# Patient Record
Sex: Female | Born: 1955 | Race: Black or African American | Hispanic: No | Marital: Married | Smoking: Never smoker
Health system: Southern US, Community
[De-identification: ages and names within clinical notes are randomized; demographics above are authoritative.]

## PROBLEM LIST (undated history)

## (undated) DIAGNOSIS — T7840XA Allergy, unspecified, initial encounter: Secondary | ICD-10-CM

## (undated) HISTORY — PX: BREAST EXCISIONAL BIOPSY: SUR124

## (undated) HISTORY — PX: COLONOSCOPY: SHX174

## (undated) HISTORY — DX: Allergy, unspecified, initial encounter: T78.40XA

## (undated) HISTORY — PX: UTERINE FIBROID SURGERY: SHX826

## (undated) HISTORY — PX: TUBAL LIGATION: SHX77

---

## 1999-06-16 ENCOUNTER — Other Ambulatory Visit: Admission: RE | Admit: 1999-06-16 | Discharge: 1999-06-16 | Payer: Self-pay | Admitting: Internal Medicine

## 2000-09-13 ENCOUNTER — Other Ambulatory Visit: Admission: RE | Admit: 2000-09-13 | Discharge: 2000-09-13 | Payer: Self-pay | Admitting: Obstetrics and Gynecology

## 2001-11-19 ENCOUNTER — Other Ambulatory Visit: Admission: RE | Admit: 2001-11-19 | Discharge: 2001-11-19 | Payer: Self-pay | Admitting: Obstetrics and Gynecology

## 2002-11-19 ENCOUNTER — Other Ambulatory Visit: Admission: RE | Admit: 2002-11-19 | Discharge: 2002-11-19 | Payer: Self-pay | Admitting: Obstetrics and Gynecology

## 2002-12-15 ENCOUNTER — Ambulatory Visit (HOSPITAL_COMMUNITY): Admission: RE | Admit: 2002-12-15 | Discharge: 2002-12-15 | Payer: Self-pay | Admitting: Obstetrics and Gynecology

## 2003-11-27 ENCOUNTER — Other Ambulatory Visit: Admission: RE | Admit: 2003-11-27 | Discharge: 2003-11-27 | Payer: Self-pay | Admitting: Obstetrics and Gynecology

## 2003-12-01 ENCOUNTER — Encounter: Admission: RE | Admit: 2003-12-01 | Discharge: 2003-12-01 | Payer: Self-pay | Admitting: Obstetrics and Gynecology

## 2004-12-05 ENCOUNTER — Other Ambulatory Visit: Admission: RE | Admit: 2004-12-05 | Discharge: 2004-12-05 | Payer: Self-pay | Admitting: Obstetrics and Gynecology

## 2005-12-07 ENCOUNTER — Other Ambulatory Visit: Admission: RE | Admit: 2005-12-07 | Discharge: 2005-12-07 | Payer: Self-pay | Admitting: Obstetrics and Gynecology

## 2009-01-06 ENCOUNTER — Encounter: Admission: RE | Admit: 2009-01-06 | Discharge: 2009-01-06 | Payer: Self-pay | Admitting: Obstetrics and Gynecology

## 2009-06-16 ENCOUNTER — Encounter: Admission: RE | Admit: 2009-06-16 | Discharge: 2009-06-16 | Payer: Self-pay | Admitting: Obstetrics and Gynecology

## 2009-06-19 ENCOUNTER — Encounter: Admission: RE | Admit: 2009-06-19 | Discharge: 2009-06-19 | Payer: Self-pay | Admitting: Obstetrics and Gynecology

## 2009-07-14 ENCOUNTER — Encounter: Admission: RE | Admit: 2009-07-14 | Discharge: 2009-07-14 | Payer: Self-pay | Admitting: Obstetrics and Gynecology

## 2009-07-22 ENCOUNTER — Ambulatory Visit (HOSPITAL_COMMUNITY): Admission: RE | Admit: 2009-07-22 | Discharge: 2009-07-23 | Payer: Self-pay | Admitting: Obstetrics and Gynecology

## 2009-08-17 ENCOUNTER — Encounter: Admission: RE | Admit: 2009-08-17 | Discharge: 2009-08-17 | Payer: Self-pay | Admitting: Interventional Radiology

## 2010-01-07 ENCOUNTER — Encounter: Admission: RE | Admit: 2010-01-07 | Discharge: 2010-01-07 | Payer: Self-pay | Admitting: Obstetrics and Gynecology

## 2010-03-02 ENCOUNTER — Ambulatory Visit (HOSPITAL_COMMUNITY): Admission: RE | Admit: 2010-03-02 | Discharge: 2010-03-02 | Payer: Self-pay | Admitting: Interventional Radiology

## 2010-03-02 ENCOUNTER — Encounter: Admission: RE | Admit: 2010-03-02 | Discharge: 2010-03-02 | Payer: Self-pay | Admitting: Interventional Radiology

## 2010-11-27 ENCOUNTER — Encounter: Payer: Self-pay | Admitting: Interventional Radiology

## 2010-11-27 ENCOUNTER — Encounter: Payer: Self-pay | Admitting: Obstetrics and Gynecology

## 2011-02-10 LAB — CBC
HCT: 40 % (ref 36.0–46.0)
Hemoglobin: 13.4 g/dL (ref 12.0–15.0)
MCHC: 33.4 g/dL (ref 30.0–36.0)
MCV: 87.6 fL (ref 78.0–100.0)
Platelets: 203 10*3/uL (ref 150–400)
RBC: 4.57 MIL/uL (ref 3.87–5.11)
RDW: 13.9 % (ref 11.5–15.5)
WBC: 4.1 10*3/uL (ref 4.0–10.5)

## 2011-02-10 LAB — BASIC METABOLIC PANEL
BUN: 15 mg/dL (ref 6–23)
CO2: 25 mEq/L (ref 19–32)
Calcium: 9.5 mg/dL (ref 8.4–10.5)
Chloride: 105 mEq/L (ref 96–112)
Creatinine, Ser: 0.93 mg/dL (ref 0.4–1.2)
GFR calc Af Amer: >60 mL/min (ref >60)
GFR calc non Af Amer: >60 mL/min (ref >60)
Glucose, Bld: 99 mg/dL (ref 70–99)
Potassium: 3.9 mEq/L (ref 3.5–5.1)
Sodium: 138 mEq/L (ref 135–145)

## 2011-02-10 LAB — PROTIME-INR
INR: 0.9 (ref 0.00–1.49)
Prothrombin Time: 12.4 seconds (ref 11.6–15.2)

## 2011-02-10 LAB — HCG, SERUM, QUALITATIVE: Preg, Serum: NEGATIVE

## 2011-02-27 ENCOUNTER — Other Ambulatory Visit: Payer: Self-pay | Admitting: Obstetrics and Gynecology

## 2011-02-27 DIAGNOSIS — N63 Unspecified lump in unspecified breast: Secondary | ICD-10-CM

## 2011-02-28 ENCOUNTER — Other Ambulatory Visit: Payer: Self-pay | Admitting: Obstetrics and Gynecology

## 2011-02-28 ENCOUNTER — Ambulatory Visit
Admission: RE | Admit: 2011-02-28 | Discharge: 2011-02-28 | Disposition: A | Discharge status: Home / Self Care | Payer: 59 | Source: Ambulatory Visit | Attending: Obstetrics and Gynecology | Admitting: Obstetrics and Gynecology

## 2011-02-28 DIAGNOSIS — N63 Unspecified lump in unspecified breast: Secondary | ICD-10-CM

## 2011-03-18 IMAGING — XA IR UTERINE FIBROID EMBOLIZATION
1 series · 12 of 24 positions shown · non-contrast
Comparison: none

Clinical Data/Indication: MENORRHAGIA

UTERINE ARTERY EMBOLIZATION
Sedation: Versed threemg, Fentanyl 150 mg.
Total Moderate Sedation Time: 75 minutes.
Contrast Volume: 70 ml [id number].
Additional Medications: None.
Fluoroscopy Time: 27.5 minutes.
Vessels selected:  Third order bilateral internal iliac.
Allergies:  .
Procedure: The procedure, risks, benefits, and alternatives were
explained to the patient. Questions regarding the procedure were
encouraged and answered. The patient understands and consents to
the procedure.
The right groin was prepped with betadine in a sterile fashion, and
a sterile drape was applied covering the operative field. A sterile
gown and sterile gloves were used for the procedure.
A 19 gauge needle was inserted into the right common femoral artery
and removed over Auntyjatty Delowr.  Mcafee 5-French sheath was inserted.  Cobra
II catheter was advanced into the aorta.  The contralateral left
common iliac artery was selected.  The internal iliac artery on the
left was selected.  Angiography was performed.  A micro catheter
was advanced over an 018 glide wire into the left uterine artery.
Angiography was performed.
Embolization was performed utilizing two vials of 500-700 micron
embospheres.
The micro catheter was removed and flushed.  Osniel Bogle loop was
created.  The ipsilateral right common iliac artery and right
internal iliac artery were selected.  Angiography was performed.  A
micro catheter was advanced over a 6638 glide wire into the right
uterine artery.  Angiography was performed.
Embolization was again performed with two vials of 500-700 micron
The micro catheter and Cobra catheter were removed.  Right femoral
angiography was performed. A STARCLOSE device was deployed without
complication and hemostasis was achieved.

[Series 1: run · 12 of 83 slices shown]
[im 4/83]
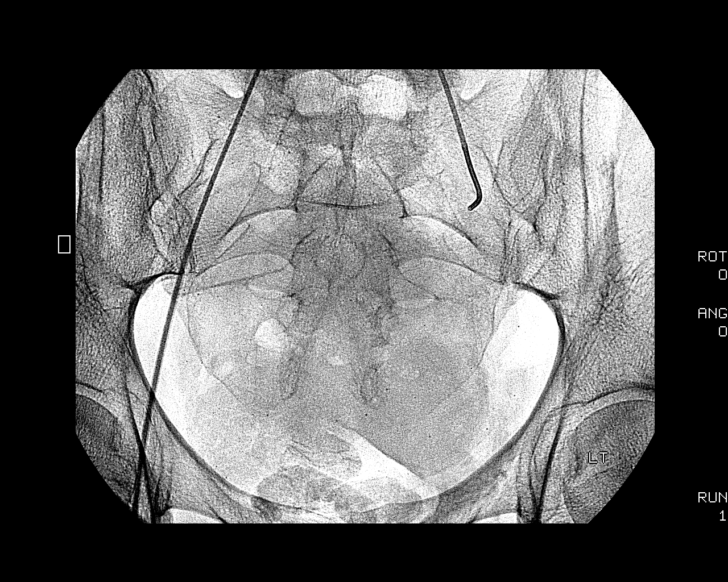
[im 11/83]
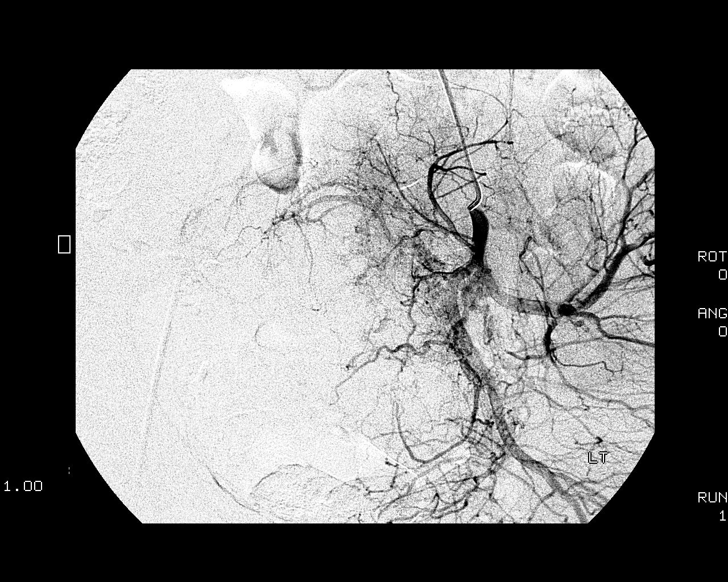
[im 18/83]
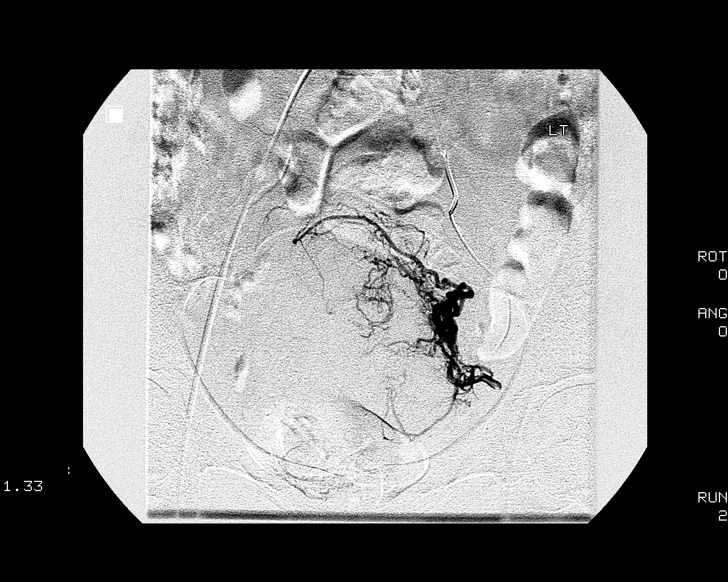
[im 25/83]
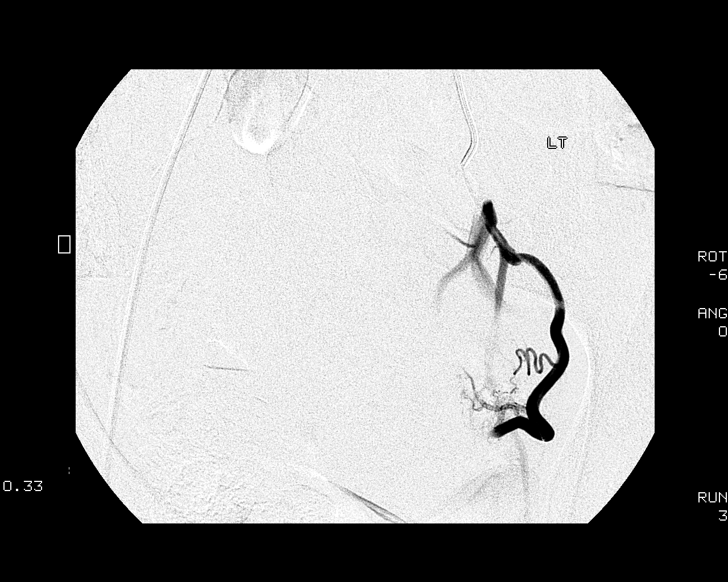
[im 33/83]
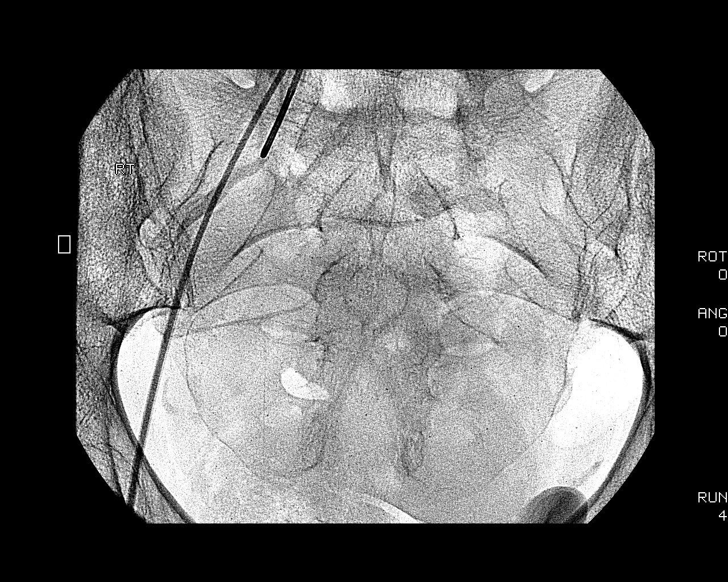
[im 40/83]
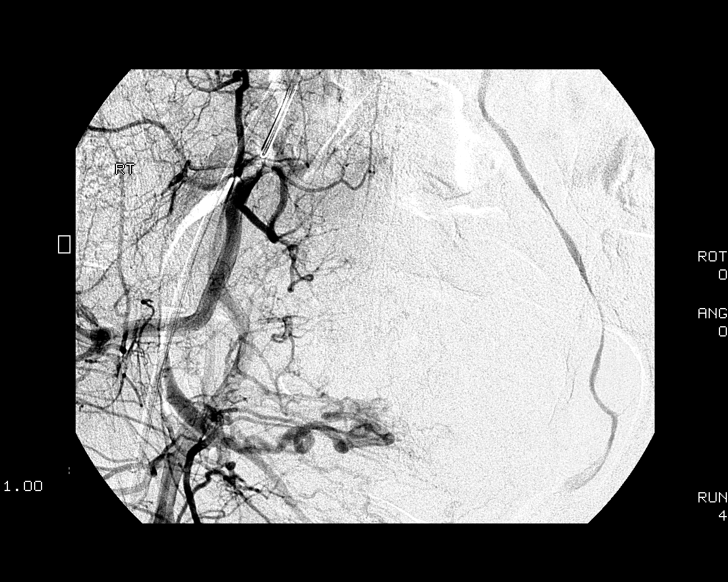
[im 47/83]
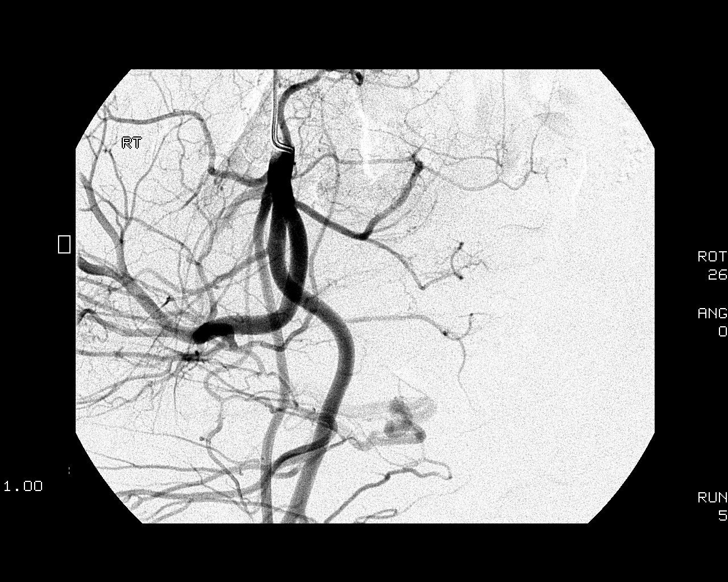
[im 54/83]
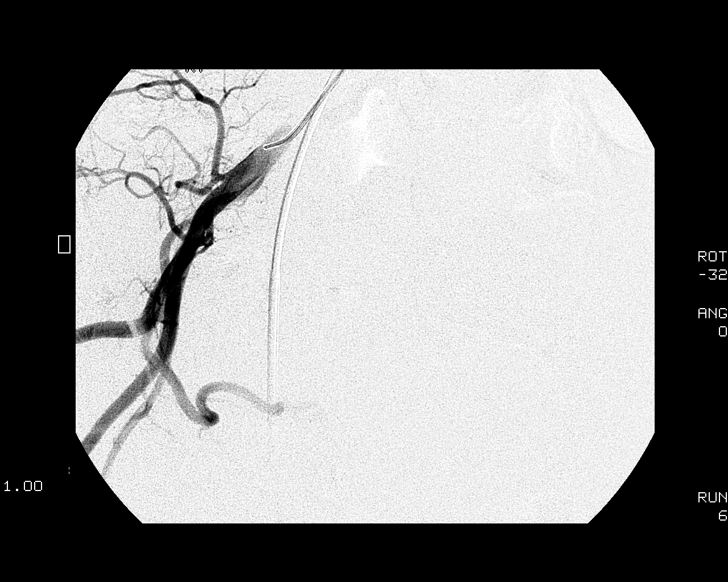
[im 61/83]
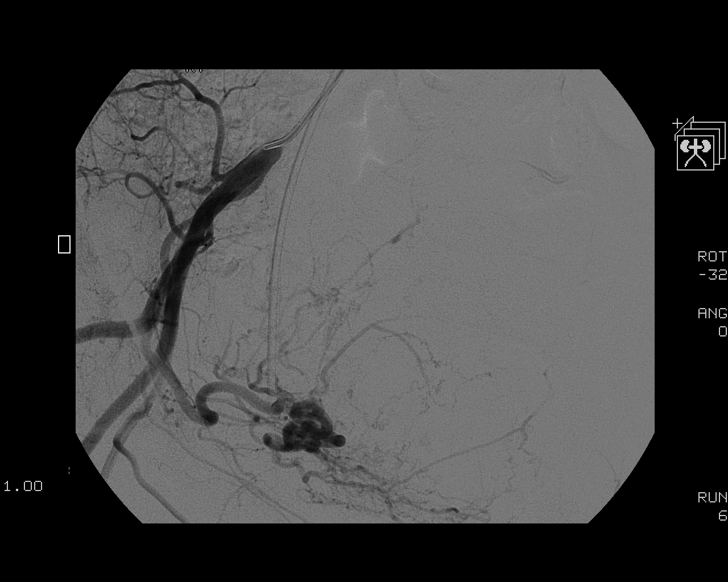
[im 68/83]
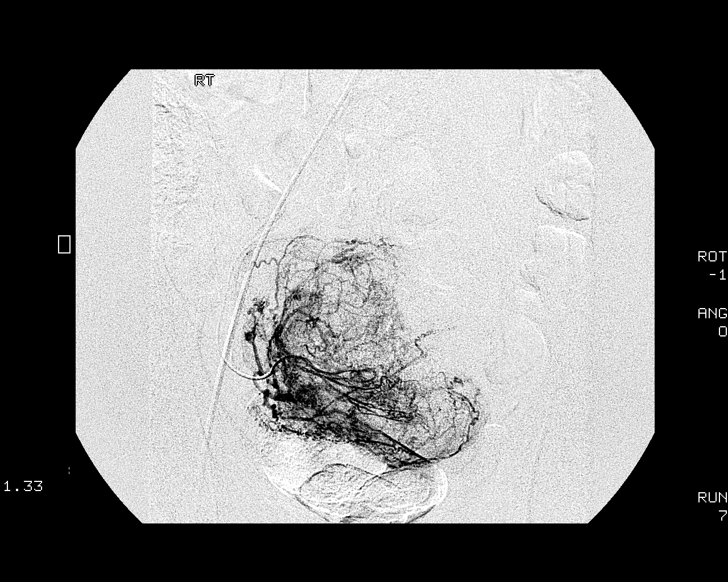
[im 75/83]
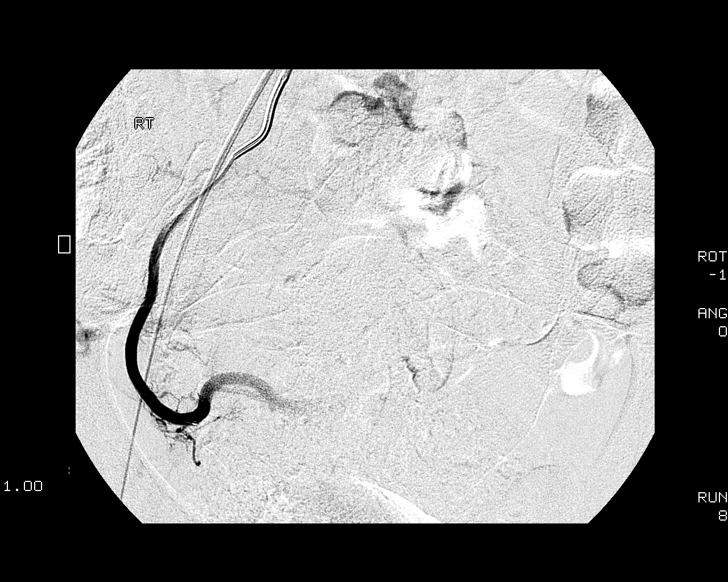
[im 83/83]
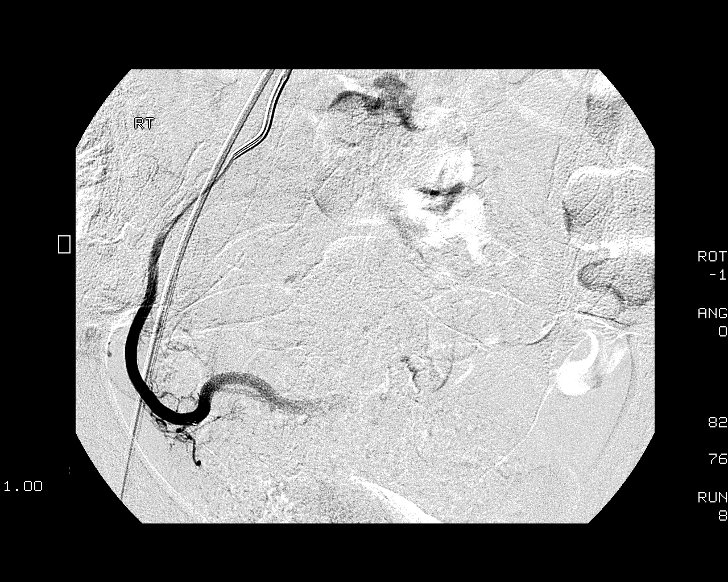

[12 of 24 positions shown; findings below may reference images not displayed]

FINDINGS: Imaging demonstrates bilateral pelvic anatomy and
bilateral uterine artery angiography.  Expected anatomy is
identified.  No blood supply from the uterine artery to the vagina
is visualized.

Post embolization angiography demonstrates relative stasis of flow
and pruning of the vasculature to the uterus.

Complications: None
IMPRESSION: Successful bilateral uterine artery embolization.

## 2011-03-24 NOTE — Op Note (Signed)
NAME:  Megan Mccoy, Megan Mccoy                         ACCOUNT NO.:  [id number]   MEDICAL RECORD NO.:  [id number]                   PATIENT TYPE:  AMB   LOCATION:  SDC                                  FACILITY:  [facility]   PHYSICIAN:  Randye Lobo, M.D.                DATE OF BIRTH:  03/09/1956   DATE OF PROCEDURE:  12/15/2002  DATE OF DISCHARGE:                                 OPERATIVE REPORT   PREOPERATIVE DIAGNOSES:  Multiparous female, desire for permanent  sterilization.   POSTOPERATIVE DIAGNOSES:  1. Multiparous female, desire for permanent sterilization.  2. Fitz-Hugh-Curtis syndrome.  3. Pelvic adhesions.   PROCEDURE:  1. Laparoscopic bilateral tubal ligation via fulguration.  2. Lysis of adhesions.   SURGEON:  Randye Lobo, M.D.   ANESTHESIA:  General endotracheal, local with 0.25% Marcaine.   IV FLUIDS:  100 mL Ringer's lactate.   ESTIMATED BLOOD LOSS:  Minimal.   URINE OUTPUT:  75 mL prior to the procedure.   COMPLICATIONS:  None.   INDICATIONS FOR PROCEDURE:  The patient is a 55 year old gravida 2, para 2  African-American female who presented to the office requesting permanent  sterilization.  The patient had declined forms of reversible contraception.  The patient was counseled regarding her options and chose to proceed with  tubal ligation after the risks, benefits, and alternatives were discussed  with her.  The patient was counseled regarding a failure rate of the  procedure which was approximately 1 in 250 to 1 in 300 which may result in  either an intrauterine or an ectopic pregnancy.   FINDINGS:  Laparoscopy demonstrated a uterus with a 1 cm fundal subserosal  fibroid which was adherent to the anterior abdominal wall by a dense  fibrotic and vascular adhesion.  The left fallopian tube had adhesions  between the fimbriated end and the ovary.  The right fallopian tube and  ovaries appeared to be normal.  There were also filmy adhesions in the  posterior  cul-de-sac which partly obliterated it.  The upper abdomen  demonstrated evidence of Fitz-Hugh-Curtis syndrome.  A portion of the  appendix was visualized and was noted to be normal.   PROCEDURE:  The patient was taken to the operating room after she was  properly identified.  The patient did receive Ancef 1 g intravenously for  antibiotic prophylaxis.  In the operating room the patient received general  endotracheal anesthesia and she was then placed in the dorsal lithotomy  position.  The abdomen and vagina were sterilely prepped and draped and the  bladder was catheterized of urine.  A speculum was placed inside the vagina  and a Hulka tenaculum was then placed.  The remainder of the instruments in  the vagina were removed at this time.  An umbilical incision was then  created sharply with a scalpel and the incision was carried down to the  fascia using an  Allis clamp.  A 10 mm trocar was then inserted directly into  the peritoneal cavity and the laparoscope confirmed proper placement.  A  pneumoperitoneum was achieved with CO2 gas and the patient was placed in the  Trendelenburg position.  A 5 mm incision was then created suprapubically and  a 5 mm trocar was placed in the midline under direct visualization of the  laparoscope.  An examination of the pelvic and abdominal organs was  performed and the findings are as noted above.  The Kleppinger forceps was  used to grasp the thick adhesion band between the uterine fibroid and the  anterior abdominal wall.  The adhesion was then cut and each end of the  adhesion was then coagulated again with bipolar cautery for excellent  hemostasis.  Attention was then turned to the right fallopian tube which was  grasped and followed all the way to its fimbriated end.  A 2 cm contiguous  segment of the fallopian tube was then coagulated for excellent blanching of  the tissue.  The same procedure performed on the right fallopian tube was  then  repeated on the left after that tube was grasped and followed to its  fimbriated end.  Again, a 2 cm contiguous segment of the fallopian tube was  fulgurated.  The filmy adhesions in the posterior cul-de-sac were then lysed  normalizing the anatomy in this area which was concentrated mostly on the  patient's left-hand side.  The edges of the adhesions were then coagulated  with bipolar cautery to create hemostasis.  All of the operative sites were  reexamined and there was no evidence of any bleeding noted.  The procedure  was therefore concluded.  The suprapubic trocar was removed under  visualization of the laparoscope.  The pneumoperitoneum was released and the  abdominal trocar and the laparoscope were removed simultaneously.  The skin  incisions were closed with subcuticular sutures of 3-0 plain and Steri-  Strips were placed over the incisions followed by bandages.  Marcaine 0.25%  was injected locally into the incisions. The Hulka tenaculum was removed  from the patient's cervix. The patient was cleansed of remaining Betadine,  awakened, extubated, and escorted to the recovery room in stable and awake  condition.  There were no complications to the procedure.  All needle,  instrument, and sponge counts were correct.                                               Randye Lobo, M.D.    BES/MEDQ  D:  12/15/2002  T:  12/15/2002  Job:  [id number]

## 2011-11-02 ENCOUNTER — Ambulatory Visit (INDEPENDENT_AMBULATORY_CARE_PROVIDER_SITE_OTHER): Payer: 59

## 2011-11-02 DIAGNOSIS — J029 Acute pharyngitis, unspecified: Secondary | ICD-10-CM

## 2011-11-02 DIAGNOSIS — J069 Acute upper respiratory infection, unspecified: Secondary | ICD-10-CM

## 2012-04-12 ENCOUNTER — Other Ambulatory Visit: Payer: Self-pay | Admitting: Obstetrics and Gynecology

## 2012-04-12 DIAGNOSIS — Z1231 Encounter for screening mammogram for malignant neoplasm of breast: Secondary | ICD-10-CM

## 2012-04-15 ENCOUNTER — Other Ambulatory Visit: Payer: Self-pay | Admitting: Obstetrics and Gynecology

## 2012-04-15 DIAGNOSIS — N6321 Unspecified lump in the left breast, upper outer quadrant: Secondary | ICD-10-CM

## 2012-04-23 ENCOUNTER — Ambulatory Visit
Admission: RE | Admit: 2012-04-23 | Discharge: 2012-04-23 | Disposition: A | Discharge status: Home / Self Care | Payer: 59 | Source: Ambulatory Visit | Attending: Obstetrics and Gynecology | Admitting: Obstetrics and Gynecology

## 2012-04-23 DIAGNOSIS — N6321 Unspecified lump in the left breast, upper outer quadrant: Secondary | ICD-10-CM

## 2012-04-26 ENCOUNTER — Ambulatory Visit: Payer: 59

## 2013-05-13 ENCOUNTER — Other Ambulatory Visit: Payer: Self-pay

## 2013-05-13 DIAGNOSIS — Z1231 Encounter for screening mammogram for malignant neoplasm of breast: Secondary | ICD-10-CM

## 2013-05-29 ENCOUNTER — Ambulatory Visit
Admission: RE | Admit: 2013-05-29 | Discharge: 2013-05-29 | Disposition: A | Discharge status: Home / Self Care | Payer: Self-pay | Source: Ambulatory Visit

## 2013-05-29 DIAGNOSIS — Z1231 Encounter for screening mammogram for malignant neoplasm of breast: Secondary | ICD-10-CM

## 2013-06-05 ENCOUNTER — Telehealth: Payer: Self-pay

## 2013-06-05 NOTE — Telephone Encounter (Signed)
Message copied by Alphonsa Overall on Thu Jun 05, 2013 10:20 AM ------      Message from: Conley Simmonds      Created: Fri May 30, 2013 11:54 AM       Please inform patient that I received her normal mammogram results.            She is a former patient of mine.              I will be happy to see her for an annual examination if she chooses and is due.       ------

## 2013-06-05 NOTE — Telephone Encounter (Signed)
Patient given results of normal mammogram and will schedule AEX with Dr. Edward Jolly.

## 2013-06-05 NOTE — Telephone Encounter (Signed)
Patient returning Amanda's call.

## 2013-06-05 NOTE — Telephone Encounter (Signed)
LMOVM to call regarding mammogram results.

## 2013-06-11 ENCOUNTER — Encounter: Payer: 59 | Admitting: Obstetrics and Gynecology

## 2015-03-03 ENCOUNTER — Other Ambulatory Visit: Payer: Self-pay

## 2015-03-03 DIAGNOSIS — Z1231 Encounter for screening mammogram for malignant neoplasm of breast: Secondary | ICD-10-CM

## 2015-03-08 ENCOUNTER — Ambulatory Visit
Admission: RE | Admit: 2015-03-08 | Discharge: 2015-03-08 | Disposition: A | Discharge status: Home / Self Care | Payer: BLUE CROSS/BLUE SHIELD | Source: Ambulatory Visit

## 2015-03-08 DIAGNOSIS — Z1231 Encounter for screening mammogram for malignant neoplasm of breast: Secondary | ICD-10-CM

## 2016-02-28 ENCOUNTER — Other Ambulatory Visit: Payer: Self-pay

## 2016-02-28 DIAGNOSIS — Z1231 Encounter for screening mammogram for malignant neoplasm of breast: Secondary | ICD-10-CM

## 2016-02-29 ENCOUNTER — Other Ambulatory Visit: Payer: Self-pay | Admitting: Family Medicine

## 2016-02-29 ENCOUNTER — Ambulatory Visit
Admission: RE | Admit: 2016-02-29 | Discharge: 2016-02-29 | Disposition: A | Discharge status: Home / Self Care | Payer: BLUE CROSS/BLUE SHIELD | Source: Ambulatory Visit | Attending: Family Medicine | Admitting: Family Medicine

## 2016-02-29 DIAGNOSIS — Z801 Family history of malignant neoplasm of trachea, bronchus and lung: Secondary | ICD-10-CM

## 2016-03-20 ENCOUNTER — Ambulatory Visit: Payer: BLUE CROSS/BLUE SHIELD

## 2016-04-05 ENCOUNTER — Ambulatory Visit: Payer: BLUE CROSS/BLUE SHIELD

## 2016-04-11 ENCOUNTER — Other Ambulatory Visit: Payer: Self-pay | Admitting: Family Medicine

## 2016-04-11 ENCOUNTER — Ambulatory Visit
Admission: RE | Admit: 2016-04-11 | Discharge: 2016-04-11 | Disposition: A | Discharge status: Home / Self Care | Payer: BLUE CROSS/BLUE SHIELD | Source: Ambulatory Visit

## 2016-04-11 DIAGNOSIS — Z1231 Encounter for screening mammogram for malignant neoplasm of breast: Secondary | ICD-10-CM

## 2016-11-20 DIAGNOSIS — J019 Acute sinusitis, unspecified: Secondary | ICD-10-CM | POA: Diagnosis not present

## 2017-05-29 DIAGNOSIS — H0014 Chalazion left upper eyelid: Secondary | ICD-10-CM | POA: Diagnosis not present

## 2017-11-06 DIAGNOSIS — E559 Vitamin D deficiency, unspecified: Secondary | ICD-10-CM

## 2017-11-06 HISTORY — DX: Vitamin D deficiency, unspecified: E55.9

## 2018-04-27 DIAGNOSIS — M25531 Pain in right wrist: Secondary | ICD-10-CM | POA: Diagnosis not present

## 2018-07-13 ENCOUNTER — Emergency Department (HOSPITAL_COMMUNITY)
Admission: EM | Admit: 2018-07-13 | Discharge: 2018-07-14 | Disposition: A | Discharge status: Home / Self Care | Payer: BLUE CROSS/BLUE SHIELD | Attending: Emergency Medicine | Admitting: Emergency Medicine

## 2018-07-13 ENCOUNTER — Encounter (HOSPITAL_COMMUNITY): Payer: Self-pay | Admitting: Emergency Medicine

## 2018-07-13 DIAGNOSIS — R001 Bradycardia, unspecified: Secondary | ICD-10-CM | POA: Diagnosis not present

## 2018-07-13 DIAGNOSIS — R61 Generalized hyperhidrosis: Secondary | ICD-10-CM | POA: Diagnosis not present

## 2018-07-13 DIAGNOSIS — R55 Syncope and collapse: Secondary | ICD-10-CM | POA: Insufficient documentation

## 2018-07-13 LAB — CBC WITH DIFFERENTIAL/PLATELET
Abs Immature Granulocytes: 0 10*3/uL (ref 0.0–0.1)
BASOS ABS: 0 10*3/uL (ref 0.0–0.1)
Basophils Relative: 1 %
EOS PCT: 1 %
Eosinophils Absolute: 0.1 10*3/uL (ref 0.0–0.7)
HCT: 42.5 % (ref 36.0–46.0)
HEMOGLOBIN: 13.3 g/dL (ref 12.0–15.0)
Immature Granulocytes: 0 %
LYMPHS PCT: 34 %
Lymphs Abs: 1.9 10*3/uL (ref 0.7–4.0)
MCH: 27.4 pg (ref 26.0–34.0)
MCHC: 31.3 g/dL (ref 30.0–36.0)
MCV: 87.4 fL (ref 78.0–100.0)
Monocytes Absolute: 0.5 10*3/uL (ref 0.1–1.0)
Monocytes Relative: 10 %
Neutro Abs: 3.1 10*3/uL (ref 1.7–7.7)
Neutrophils Relative %: 54 %
Platelets: 232 10*3/uL (ref 150–400)
RBC: 4.86 MIL/uL (ref 3.87–5.11)
RDW: 12.8 % (ref 11.5–15.5)
WBC: 5.7 10*3/uL (ref 4.0–10.5)

## 2018-07-13 LAB — I-STAT TROPONIN, ED: TROPONIN I, POC: 0 ng/mL (ref 0.00–0.08)

## 2018-07-13 MED ORDER — SODIUM CHLORIDE 0.9 % IV BOLUS
1000.0000 mL | Freq: Once | INTRAVENOUS | Status: AC
  Administered 2018-07-14: 1000 mL via INTRAVENOUS

## 2018-07-13 NOTE — ED Triage Notes (Signed)
Patient was at [employer], had two syncopal episodes and fell.  She denies any injury or head, neck or back pain.  Patient was initially diaphoretic with EMS.  Patient was was completely unresponsive at the restaurant and per medic on scene.  Patient is now CAOx4.  No chest pain or shortness of breath.  She did vomit x2 before syncope.

## 2018-07-14 ENCOUNTER — Other Ambulatory Visit: Payer: Self-pay

## 2018-07-14 DIAGNOSIS — R55 Syncope and collapse: Secondary | ICD-10-CM | POA: Diagnosis not present

## 2018-07-14 LAB — COMPREHENSIVE METABOLIC PANEL
ALBUMIN: 4 g/dL (ref 3.5–5.0)
ALT: 18 U/L (ref 0–44)
ANION GAP: 11 (ref 5–15)
AST: 22 U/L (ref 15–41)
Alkaline Phosphatase: 50 U/L (ref 38–126)
BUN: 14 mg/dL (ref 6–20)
CO2: 24 mmol/L (ref 22–32)
CREATININE: 1.04 mg/dL — AB (ref 0.44–1.00)
Calcium: 9 mg/dL (ref 8.9–10.3)
Chloride: 102 mmol/L (ref 98–111)
GFR calc non Af Amer: 58 mL/min — ABNORMAL LOW (ref >60)
GLUCOSE: 84 mg/dL (ref 70–99)
Potassium: 3.2 mmol/L — ABNORMAL LOW (ref 3.5–5.1)
SODIUM: 137 mmol/L (ref 135–145)
Total Bilirubin: 0.6 mg/dL (ref 0.3–1.2)
Total Protein: 7.6 g/dL (ref 6.5–8.1)

## 2018-07-14 LAB — I-STAT TROPONIN, ED: Troponin i, poc: 0.02 ng/mL (ref 0.00–0.08)

## 2018-07-14 MED ORDER — POTASSIUM CHLORIDE CRYS ER 20 MEQ PO TBCR
40.0000 meq | EXTENDED_RELEASE_TABLET | Freq: Once | ORAL | Status: AC
  Administered 2018-07-14: 40 meq via ORAL
  Filled 2018-07-14: qty 2

## 2018-07-14 NOTE — ED Notes (Signed)
Patient verbalizes understanding of medications and discharge instructions. No further questions at this time. VSS and patient ambulatory at discharge.   

## 2018-07-14 NOTE — ED Provider Notes (Addendum)
[facility] EMERGENCY DEPARTMENT Provider Note  CSN: [id number] Arrival date & time: 07/13/18 2224  Chief Complaint(s) Loss of Consciousness  HPI Megan Mccoy is a 62 y.o. female with no pertinent past medical history presents to the emergency department with episode of syncope.  Patient was at a restaurant and drinking alcoholic drink and eating dinner.  Shortly after eating dinner, patient had 2 episodes of emesis.  While attempting to stand up she syncopized.  She was assisted down by family.  There is no associated trauma.  Patient reports feeling flushed, lightheaded, diaphoretic, and nauseated prior to the emesis and the syncope.  She denies any recent fevers or infections.  No associated chest pain or shortness of breath.  Denies any prior cardiac history.  Denies any associated palpitations.  No abdominal pain.  Denies any recent hematochezia or melena.  Patient did report that she did not have much to eat or drink today stating she only had some smoothies for breakfast and lunch.  States that she was out and about for most of the day.  HPI  Past Medical History History reviewed. No pertinent past medical history. There are no active problems to display for this patient.  Home Medication(s) Prior to Admission medications   Medication Sig Start Date End Date Taking? Authorizing Provider  Biotin 1000 MCG tablet Take 1,000 mcg by mouth daily.   Yes [provider]                                                                                                                                    Past Surgical History History reviewed. No pertinent surgical history. Family History No family history on file.  Social History Social History   Tobacco Use  . Smoking status: Never Smoker  Substance Use Topics  . Alcohol use: Not on file  . Drug use: Not on file   Allergies Patient has no known allergies.  Review of Systems Review of Systems All  other systems are reviewed and are negative for acute change except as noted in the HPI  Physical Exam Vital Signs  I have reviewed the triage vital signs BP 109/75   Pulse 74   Temp 97.7 F (36.5 C) (Oral)   Resp 10   Ht 5\' 3"  (1.6 m)   Wt 67.1 kg   SpO2 100%   BMI 26.22 kg/m   Physical Exam  Constitutional: She is oriented to person, place, and time. She appears well-developed and well-nourished. No distress.  HENT:  Head: Normocephalic and atraumatic.  Nose: Nose normal.  Eyes: Pupils are equal, round, and reactive to light. Conjunctivae and EOM are normal. Right eye exhibits no discharge. Left eye exhibits no discharge. No scleral icterus.  Neck: Normal range of motion. Neck supple.  Cardiovascular: Normal rate and regular rhythm. Exam reveals no gallop and no friction rub.  No murmur heard. Pulmonary/Chest: Effort normal and  breath sounds normal. No stridor. No respiratory distress. She has no rales.  Abdominal: Soft. She exhibits no distension. There is no tenderness.  Musculoskeletal: She exhibits no edema or tenderness.  Neurological: She is alert and oriented to person, place, and time.  Skin: Skin is warm and dry. No rash noted. She is not diaphoretic. No erythema.  Psychiatric: She has a normal mood and affect.  Vitals reviewed.   ED Results and Treatments Labs (all labs ordered are listed, but only abnormal results are displayed) Labs Reviewed  COMPREHENSIVE METABOLIC PANEL - Abnormal; Notable for the following components:      Result Value   Potassium 3.2 (*)    Creatinine, Ser 1.04 (*)    GFR calc non Af Amer 58 (*)    All other components within normal limits  CBC WITH DIFFERENTIAL/PLATELET  I-STAT TROPONIN, ED  I-STAT TROPONIN, ED                                                                                                                         EKG  EKG Interpretation  Date/Time:  Saturday July 13 2018 22:37:54 EDT Ventricular Rate:  59 PR  Interval:    QRS Duration: 88 QT Interval:  388 QTC Calculation: 385 R Axis:   32 Text Interpretation:  Sinus rhythm Borderline prolonged PR interval No old tracing to compare Confirmed by Addison Lank [id number]) on 07/13/2018 10:56:41 PM      Radiology No results found. Pertinent labs & imaging results that were available during my care of the patient were reviewed by me and considered in my medical decision making (see chart for details).  Medications Ordered in ED Medications  sodium chloride 0.9 % bolus 1,000 mL (0 mLs Intravenous Stopped 07/14/18 0245)  potassium chloride SA (K-DUR,KLOR-CON) CR tablet 40 mEq (40 mEq Oral Given 07/14/18 0244)                                                                                                                                    Procedures Procedures  (including critical care time)  Medical Decision Making / ED Course I have reviewed the nursing notes for this encounter and the patient's prior records (if available in EHR or on provided paperwork).    Syncope in the setting of emesis.  Given the fact that the patient had minimal oral intake throughout the day today, possible hypoglycemia versus  vasovagal.   EKG without acute ischemic changes, dysrhythmias, blocks, Brugada, epsilon waves, HOCM.  Troponin negative x2. doubt cardiac etiology.  Labs without leukocytosis or anemia.  Did reveal mild hypokalemia and renal insufficiency.   Orthostatics reassuring.  Provided with IV hydration.  Ambulated without complication..  Final Clinical Impression(s) / ED Diagnoses Final diagnoses:  Syncope and collapse   Disposition: Discharge  Condition: Good  I have discussed the results, Dx and Tx plan with the patient who expressed understanding and agree(s) with the plan. Discharge instructions discussed at great length. The patient was given strict return precautions who verbalized understanding of the instructions. No further questions at time  of discharge.    ED Discharge Orders    None       Follow Up: Primary care provider  Schedule an appointment as soon as possible for a visit  As needed      This chart was dictated using voice recognition software.  Despite best efforts to proofread,  errors can occur which can change the documentation meaning.   Fatima Blank, MD 07/14/18 [place], [place], MD 07/23/18 [phone]

## 2018-07-14 NOTE — ED Notes (Addendum)
Patient ambulated to bathroom with minimal assistance. Steady gait noted. Patient denies dizziness.

## 2018-07-17 ENCOUNTER — Encounter: Payer: Self-pay | Admitting: Medical

## 2018-07-17 ENCOUNTER — Ambulatory Visit: Payer: BLUE CROSS/BLUE SHIELD | Admitting: Medical

## 2018-07-17 VITALS — BP 120/70 | HR 64 | Temp 98.1°F | Resp 16 | Ht 64.5 in | Wt 151.2 lb

## 2018-07-17 DIAGNOSIS — Z1231 Encounter for screening mammogram for malignant neoplasm of breast: Secondary | ICD-10-CM

## 2018-07-17 DIAGNOSIS — Z0000 Encounter for general adult medical examination without abnormal findings: Secondary | ICD-10-CM | POA: Insufficient documentation

## 2018-07-17 DIAGNOSIS — R55 Syncope and collapse: Secondary | ICD-10-CM

## 2018-07-17 DIAGNOSIS — N63 Unspecified lump in unspecified breast: Secondary | ICD-10-CM | POA: Diagnosis not present

## 2018-07-17 DIAGNOSIS — Z1239 Encounter for other screening for malignant neoplasm of breast: Secondary | ICD-10-CM

## 2018-07-17 DIAGNOSIS — R112 Nausea with vomiting, unspecified: Secondary | ICD-10-CM

## 2018-07-17 DIAGNOSIS — Z1159 Encounter for screening for other viral diseases: Secondary | ICD-10-CM | POA: Insufficient documentation

## 2018-07-17 NOTE — Progress Notes (Signed)
Subjective: Chief Complaint  Patient presents with  . np    np get established, vomitted and fainted at [employer] [address]   Here as a new patient.   Was seeing Dr. Criss Rosales in the past.   Had 2 episodes of vomiting at restaurant this past weekend, ended up passing out.  She had been out in the heat shopping all day, and felt bad at dinner that evening 9pm.   EMS took her to [facility]..  She was evaluated at [facility] ED.  They noted her potassium was low. Was given IV fluids.   She felt fine later and went home.   Feels fine today.   Exercise - aerobics, walks, jogs, goes to [facility].   Eats healthy.  She had a flu shot this past Monday at pharmacy  Needs updated mammogram.  Has hx/o left breast lump, calcification.  Was getting mammogram q49mo, but last one was told to go to yearly.  Due now.    Last colonoscopy 2009.    Past Medical History:  Diagnosis Date  . Allergy      Current Outpatient Medications on File Prior to Visit  Medication Sig Dispense Refill  . Multiple Vitamins-Minerals (MULTIVITAMIN WITH MINERALS) tablet Take 1 tablet by mouth daily.     No current facility-administered medications on file prior to visit.    ROS as in subjective   Objective: BP 120/70   Pulse 64   Temp 98.1 F (36.7 C) (Oral)   Resp 16   Ht 5' 4.5" (1.638 m)   Wt 151 lb 3.2 oz (68.6 kg)   SpO2 99%   BMI 25.55 kg/m    General appearance: alert, no distress, WD/WN, lean AA female HEENT: normocephalic, sclerae anicteric, PERRLA, EOMi, nares patent, no discharge or erythema, pharynx normal Oral cavity: MMM, no lesions Neck: supple, no lymphadenopathy, no thyromegaly, no masses Heart: RRR, normal S1, S2, no murmurs Lungs: CTA bilaterally, no wheezes, rhonchi, or rales Extremities: no edema, no cyanosis, no clubbing Pulses: 2+ symmetric, upper and lower extremities, normal cap refill Neurological: alert, oriented x 3, CN2-12 intact, strength normal upper extremities and  lower extremities, sensation normal throughout, DTRs 2+ throughout, no cerebellar signs, gait normal Psychiatric: normal affect, behavior normal, pleasant     Assessment: Encounter Diagnoses  Name Primary?  . Syncope, unspecified syncope type Yes  . Breast lump   . Nausea and vomiting, intractability of vomiting not specified, unspecified vomiting type   . Screening for breast cancer     Plan: Reviewed emergency dept note, labs, EKG.  She is feeling fine today.  Advised she be more cautious, less intense with activity/exercise this week.   return to activity prn.  Will set up for mammograms as requested  Will request copy of recent flu shot, last colonoscopy 2009.  She will get [place] copy of date of last pneumococcal and Shingrix vaccine  She is due for last Shingrix.  She will check insurance coverage for this.  Return soon for physical  [place] was seen today for np.  Diagnoses and all orders for this visit:  Syncope, unspecified syncope type  Breast lump -     MM Digital Diagnostic Unilat L; Future -     MM Digital Screening Unilat R; Future  Nausea and vomiting, intractability of vomiting not specified, unspecified vomiting type  Screening for breast cancer -     MM Digital Diagnostic Unilat L; Future -     MM Digital  Screening Unilat R; Future

## 2018-08-12 ENCOUNTER — Other Ambulatory Visit: Payer: Self-pay | Admitting: Medical

## 2018-08-12 DIAGNOSIS — N6320 Unspecified lump in the left breast, unspecified quadrant: Secondary | ICD-10-CM

## 2018-08-20 ENCOUNTER — Ambulatory Visit
Admission: RE | Admit: 2018-08-20 | Discharge: 2018-08-20 | Disposition: A | Discharge status: Home / Self Care | Payer: BLUE CROSS/BLUE SHIELD | Source: Ambulatory Visit | Attending: Medical | Admitting: Medical

## 2018-08-20 DIAGNOSIS — N6320 Unspecified lump in the left breast, unspecified quadrant: Secondary | ICD-10-CM

## 2018-08-20 DIAGNOSIS — N6321 Unspecified lump in the left breast, upper outer quadrant: Secondary | ICD-10-CM | POA: Diagnosis not present

## 2018-08-20 DIAGNOSIS — R922 Inconclusive mammogram: Secondary | ICD-10-CM | POA: Diagnosis not present

## 2018-08-28 ENCOUNTER — Ambulatory Visit (INDEPENDENT_AMBULATORY_CARE_PROVIDER_SITE_OTHER): Payer: BLUE CROSS/BLUE SHIELD | Admitting: Medical

## 2018-08-28 ENCOUNTER — Encounter: Payer: Self-pay | Admitting: Medical

## 2018-08-28 ENCOUNTER — Other Ambulatory Visit (HOSPITAL_COMMUNITY)
Admission: RE | Admit: 2018-08-28 | Discharge: 2018-08-28 | Disposition: A | Discharge status: Home / Self Care | Payer: BLUE CROSS/BLUE SHIELD | Source: Ambulatory Visit | Attending: Family Medicine | Admitting: Family Medicine

## 2018-08-28 VITALS — BP 120/70 | HR 63 | Temp 97.5°F | Resp 16 | Ht 64.0 in | Wt 155.0 lb

## 2018-08-28 DIAGNOSIS — E876 Hypokalemia: Secondary | ICD-10-CM | POA: Diagnosis not present

## 2018-08-28 DIAGNOSIS — Z1211 Encounter for screening for malignant neoplasm of colon: Secondary | ICD-10-CM

## 2018-08-28 DIAGNOSIS — Z124 Encounter for screening for malignant neoplasm of cervix: Secondary | ICD-10-CM | POA: Insufficient documentation

## 2018-08-28 DIAGNOSIS — Z0000 Encounter for general adult medical examination without abnormal findings: Secondary | ICD-10-CM

## 2018-08-28 DIAGNOSIS — Z1159 Encounter for screening for other viral diseases: Secondary | ICD-10-CM | POA: Diagnosis not present

## 2018-08-28 DIAGNOSIS — Z23 Encounter for immunization: Secondary | ICD-10-CM | POA: Diagnosis not present

## 2018-08-28 DIAGNOSIS — E2839 Other primary ovarian failure: Secondary | ICD-10-CM

## 2018-08-28 DIAGNOSIS — R319 Hematuria, unspecified: Secondary | ICD-10-CM | POA: Diagnosis not present

## 2018-08-28 DIAGNOSIS — Z780 Asymptomatic menopausal state: Secondary | ICD-10-CM

## 2018-08-28 LAB — POCT URINALYSIS DIP (PROADVANTAGE DEVICE)
Bilirubin, UA: NEGATIVE
Glucose, UA: NEGATIVE mg/dL
Ketones, POC UA: NEGATIVE mg/dL
Leukocytes, UA: NEGATIVE
Nitrite, UA: NEGATIVE
PH UA: 6 (ref 5.0–8.0)
Protein Ur, POC: NEGATIVE mg/dL
SPECIFIC GRAVITY, URINE: 1.025
UUROB: NEGATIVE

## 2018-08-28 LAB — POCT UA - MICROSCOPIC ONLY

## 2018-08-28 NOTE — Progress Notes (Signed)
Subjective:   HPI  Megan Mccoy is a 62 y.o. female who presents for Chief Complaint  Patient presents with  . CPE    fasting CPE eye exam 6 months ago  wants pap    Medical care team includes: Hampton Cost, Camelia Eng, PA-C here for primary care Dentist Eye doctor  Concerns: Hx/o 2 pregnancies with live births, no current bleeding, hx/o fibroid surgery.     Reviewed their medical, surgical, family, social, medication, and allergy history and updated chart as appropriate.  Past Medical History:  Diagnosis Date  . Allergy     Past Surgical History:  Procedure Laterality Date  . BREAST EXCISIONAL BIOPSY Left   . COLONOSCOPY     age 42yo, repeat age 79yo  . TUBAL LIGATION    . UTERINE FIBROID SURGERY      Social History   Socioeconomic History  . Marital status: Married    Spouse name: Not on file  . Number of children: Not on file  . Years of education: Not on file  . Highest education level: Not on file  Occupational History  . Not on file  Social Needs  . Financial resource strain: Not on file  . Food insecurity:    Worry: Not on file    Inability: Not on file  . Transportation needs:    Medical: Not on file    Non-medical: Not on file  Tobacco Use  . Smoking status: Never Smoker  . Smokeless tobacco: Never Used  Substance and Sexual Activity  . Alcohol use: Yes    Alcohol/week: 2.0 standard drinks    Types: 2 Glasses of wine per week  . Drug use: Never  . Sexual activity: Not on file  Lifestyle  . Physical activity:    Days per week: Not on file    Minutes per session: Not on file  . Stress: Not on file  Relationships  . Social connections:    Talks on phone: Not on file    Gets together: Not on file    Attends religious service: Not on file    Active member of club or organization: Not on file    Attends meetings of clubs or organizations: Not on file    Relationship status: Not on file  . Intimate partner violence:    Fear of current or ex  partner: Not on file    Emotionally abused: Not on file    Physically abused: Not on file    Forced sexual activity: Not on file  Other Topics Concern  . Not on file  Social History Narrative   Married, 2 children, ages 22yo and 90yo, 2 grandchildren.   Works at [employer], does traffic commercials, exercise - zumba, walking, running, goes to [employer].   08/2018    Family History  Problem Relation Age of Onset  . Diabetes Mother   . Heart disease Mother 64       MI  . Cancer Father        lung  . Cancer Brother        lung  . Heart disease Brother        died in 8s of hole in the heart  . Breast cancer Paternal Grandmother      Current Outpatient Medications:  Marland Kitchen  Multiple Vitamins-Minerals (MULTIVITAMIN WITH MINERALS) tablet, Take 1 tablet by mouth daily., Disp: , Rfl:   No Known Allergies   Review of Systems Constitutional: -fever, -chills, -sweats, -unexpected weight  change, -decreased appetite, -fatigue Allergy: -sneezing, -itching, -congestion Dermatology: -changing moles, --rash, -lumps ENT: -runny nose, -ear pain, -sore throat, -hoarseness, -sinus pain, -teeth pain, - ringing in ears, -hearing loss, -nosebleeds Cardiology: -chest pain, -palpitations, -swelling, -difficulty breathing when lying flat, -waking up short of breath Respiratory: -cough, -shortness of breath, -difficulty breathing with exercise or exertion, -wheezing, -coughing up blood Gastroenterology: -abdominal pain, -nausea, -vomiting, -diarrhea, -constipation, -blood in stool, -changes in bowel movement, -difficulty swallowing or eating Hematology: -bleeding, -bruising  Musculoskeletal: -joint aches, -muscle aches, -joint swelling, -back pain, -neck pain, -cramping, -changes in gait Ophthalmology: denies vision changes, eye redness, itching, discharge Urology: -burning with urination, -difficulty urinating, -blood in urine, -urinary frequency, -urgency, -incontinence Neurology: -headache, -weakness,  -tingling, -numbness, -memory loss, -falls, -dizziness Psychology: -depressed mood, -agitation, -sleep problems Breast/gyn: -breast tendnerss, -discharge, -lumps, -vaginal discharge,- irregular periods, -heavy periods     Objective:  BP 120/70   Pulse 63   Temp (!) 97.5 F (36.4 C) (Oral)   Resp 16   Ht 5\' 4"  (1.626 m)   Wt 155 lb (70.3 kg)   SpO2 99%   BMI 26.61 kg/m   General appearance: alert, no distress, WD/WN,lean African American female Skin: left lower medial leg with 5cm x 2.5cm flat birth mark, no worrisome lesions HEENT: normocephalic, conjunctiva/corneas normal, sclerae anicteric, PERRLA, EOMi, nares patent, no discharge or erythema, pharynx normal Oral cavity: MMM, tongue normal, teeth in good repair Neck: supple, no lymphadenopathy, no thyromegaly, no masses, normal ROM, no bruits Chest: non tender, normal shape and expansion Heart: RRR, normal S1, S2, no murmurs Lungs: CTA bilaterally, no wheezes, rhonchi, or rales Abdomen: +bs, soft, non tender, non distended, no masses, no hepatomegaly, no splenomegaly, no bruits Back: non tender, normal ROM, no scoliosis Musculoskeletal: upper extremities non tender, no obvious deformity, normal ROM throughout, lower extremities non tender, no obvious deformity, normal ROM throughout Extremities: no edema, no cyanosis, no clubbing Pulses: 2+ symmetric, upper and lower extremities, normal cap refill Neurological: alert, oriented x 3, CN2-12 intact, strength normal upper extremities and lower extremities, sensation normal throughout, DTRs 2+ throughout, no cerebellar signs, gait normal Psychiatric: normal affect, behavior normal, pleasant  Breast: nontender, no masses or lumps, no skin changes, no nipple discharge or inversion, no axillary lymphadenopathy Gyn: Normal external genitalia without lesions, vagina with normal mucosa, cervix without lesions, no cervical motion tenderness, no abnormal vaginal discharge.  Uterus and adnexa  not enlarged, nontender, no masses.   Pap performed.  Exam chaperoned by nurse. Rectal: anus normal appearing   Assessment and Plan :   Encounter Diagnoses  Name Primary?  . Routine general medical examination at a health care facility Yes  . Screen for colon cancer   . Need for Tdap vaccination   . Hypokalemia   . Post-menopausal   . Encounter for hepatitis C screening test for low risk patient   . Estrogen deficiency     Physical exam - discussed and counseled on healthy lifestyle, diet, exercise, preventative care, vaccinations, sick and well care, proper use of emergency dept and after hours care, and addressed their concerns.    Health screening: Bone density - recommended bone density evaluation due to risk factors postmenopausal estrogen deficiency See your eye doctor yearly for routine vision care. See your dentist yearly for routine dental care including hygiene visits twice yearly.  Cancer screening Discussed and advised monthly self breast exams Discussed mammogram, reviewed recent mammogram.  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 Discussed pap smear recommendations.   Pap smear sent  Colonoscopy:  Referred for screening colonoscopy  Vaccinations: Counseled on the Tdap (tetanus, diptheria, and acellular pertussis) vaccine.  Vaccine information sheet given. Tdap vaccine given after consent obtained.  Up to date on flu shot and shingrix   Acute issues discussed: none  Separate significant chronic issues discussed: none  Clydia was seen today for cpe.  Diagnoses and all orders for this visit:  Routine general medical examination at a health care facility -     POCT Urinalysis DIP (Proadvantage Device) -     Ambulatory referral to Gastroenterology -     Basic metabolic panel -     Lipid panel -     TSH -     VITAMIN D 25 Hydroxy (Vit-D  Deficiency, Fractures) -     HIV Antibody (routine testing w rflx) -     Hepatitis C antibody -     DG Bone Density; Future  Screen for colon cancer -     Ambulatory referral to Gastroenterology  Need for Tdap vaccination  Hypokalemia -     Basic metabolic panel  Post-menopausal -     DG Bone Density; Future  Encounter for hepatitis C screening test for low risk patient -     Hepatitis C antibody  Estrogen deficiency -     DG Bone Density; Future   Follow-up pending labs, yearly for physical

## 2018-08-28 NOTE — Patient Instructions (Signed)
Thanks for trusting [place] with your health care and for coming in for a physical today.  Below are some general recommendations I have for you:  Yearly screenings See your eye doctor yearly for routine vision care. See your dentist yearly for routine dental care including hygiene visits twice yearly. See me here yearly for a routine physical and preventative care visit   Cancer screening Colon cancer screening:   We will refer you for screening colonoscopy   Osteoporosis screening/Bone Density test - I recommend a bone density test if you are over 39 years old, or under 51 years old if you have risk factors such as a bone fracture as an adult, parent with history of bone fracture as adult, thyroid disease, early menopause, or underweight for example.   Please follow up yearly for a physical.    I have included other useful information below for your review.  Preventative Care for Adults - Female      MAINTAIN REGULAR HEALTH EXAMS:  A routine yearly physical is a good way to check in with your primary care provider about your health and preventive screening. It is also an opportunity to share updates about your health and any concerns you have, and receive a thorough all-over exam.   Most health insurance companies pay for at least some preventative services.  Check with your health plan for specific coverages.  WHAT PREVENTATIVE SERVICES DO WOMEN NEED?  Adult women should have their weight and blood pressure checked regularly.   Women age 65 and older should have their cholesterol levels checked regularly.  Women should be screened for cervical cancer with a Pap smear and pelvic exam beginning at either age 3, or 3 years after they become sexually activity.    Breast cancer screening generally begins at age 108 with a mammogram and breast exam by your primary care provider.    Beginning at age 23 and continuing to age 80, women should be screened for colorectal cancer.   Certain people may need continued testing until age 44.  Updating vaccinations is part of preventative care.  Vaccinations help protect against diseases such as the flu.  Osteoporosis is a disease in which the bones lose minerals and strength as we age. Women ages 28 and over should discuss this with their caregivers, as should women after menopause who have other risk factors.  Lab tests are generally done as part of preventative care to screen for anemia and blood disorders, to screen for problems with the kidneys and liver, to screen for bladder problems, to check blood sugar, and to check your cholesterol level.  Preventative services generally include counseling about diet, exercise, avoiding tobacco, drugs, excessive alcohol consumption, and sexually transmitted infections.    GENERAL RECOMMENDATIONS FOR GOOD HEALTH:  Healthy diet:  Eat a variety of foods, including fruit, vegetables, animal or vegetable protein, such as meat, fish, chicken, and eggs, or beans, lentils, tofu, and grains, such as rice.  Drink plenty of water daily.  Decrease saturated fat in the diet, avoid lots of red meat, processed foods, sweets, fast foods, and fried foods.  Exercise:  Aerobic exercise helps maintain good heart health. At least 30-40 minutes of moderate-intensity exercise is recommended. For example, a brisk walk that increases your heart rate and breathing. This should be done on most days of the week.   Find a type of exercise or a variety of exercises that you enjoy so that it becomes a part of your daily life.  Examples are running, walking, swimming, water aerobics, and biking.  For motivation and support, explore group exercise such as aerobic class, spin class, Zumba, Yoga,or  martial arts, etc.    Set exercise goals for yourself, such as a certain weight goal, walk or run in a race such as a 5k walk/run.  Speak to your primary care provider about exercise goals.  Disease prevention:  If  you smoke or chew tobacco, find out from your caregiver how to quit. It can literally save your life, no matter how long you have been a tobacco user. If you do not use tobacco, never begin.   Maintain a healthy diet and normal weight. Increased weight leads to problems with blood pressure and diabetes.   The Body Mass Index or BMI is a way of measuring how much of your body is fat. Having a BMI above 27 increases the risk of heart disease, diabetes, hypertension, stroke and other problems related to obesity. Your caregiver can help determine your BMI and based on it develop an exercise and dietary program to help you achieve or maintain this important measurement at a healthful level.  High blood pressure causes heart and blood vessel problems.  Persistent high blood pressure should be treated with medicine if weight loss and exercise do not work.   Fat and cholesterol leaves deposits in your arteries that can block them. This causes heart disease and vessel disease elsewhere in your body.  If your cholesterol is found to be high, or if you have heart disease or certain other medical conditions, then you may need to have your cholesterol monitored frequently and be treated with medication.   Ask if you should have a cardiac stress test if your history suggests this. A stress test is a test done on a treadmill that looks for heart disease. This test can find disease prior to there being a problem.  Menopause can be associated with physical symptoms and risks. Hormone replacement therapy is available to decrease these. You should talk to your caregiver about whether starting or continuing to take hormones is right for you.   Osteoporosis is a disease in which the bones lose minerals and strength as we age. This can result in serious bone fractures. Risk of osteoporosis can be identified using a bone density scan. Women ages 77 and over should discuss this with their caregivers, as should women after  menopause who have other risk factors. Ask your caregiver whether you should be taking a calcium supplement and Vitamin D, to reduce the rate of osteoporosis.   Avoid drinking alcohol in excess (more than two drinks per day).  Avoid use of street drugs. Do not share needles with anyone. Ask for professional help if you need assistance or instructions on stopping the use of alcohol, cigarettes, and/or drugs.  Brush your teeth twice a day with fluoride toothpaste, and floss once a day. Good oral hygiene prevents tooth decay and gum disease. The problems can be painful, unattractive, and can cause other health problems. Visit your dentist for a routine oral and dental check up and preventive care every 6-12 months.   Look at your skin regularly.  Use a mirror to look at your back. Notify your caregivers of changes in moles, especially if there are changes in shapes, colors, a size larger than a pencil eraser, an irregular border, or development of new moles.  Safety:  Use seatbelts 100% of the time, whether driving or as a passenger.  Use safety  devices such as hearing protection if you work in environments with loud noise or significant background noise.  Use safety glasses when doing any work that could send debris in to the eyes.  Use a helmet if you ride a bike or motorcycle.  Use appropriate safety gear for contact sports.  Talk to your caregiver about gun safety.  Use sunscreen with a SPF (or skin protection factor) of 15 or greater.  Lighter skinned people are at a greater risk of skin cancer. Don't forget to also wear sunglasses in order to protect your eyes from too much damaging sunlight. Damaging sunlight can accelerate cataract formation.   Practice safe sex. Use condoms. Condoms are used for birth control and to help reduce the spread of sexually transmitted infections (or STIs).  Some of the STIs are gonorrhea (the clap), chlamydia, syphilis, trichomonas, herpes, HPV (human papilloma virus)  and HIV (human immunodeficiency virus) which causes AIDS. The herpes, HIV and HPV are viral illnesses that have no cure. These can result in disability, cancer and death.   Keep carbon monoxide and smoke detectors in your home functioning at all times. Change the batteries every 6 months or use a model that plugs into the wall.   Vaccinations:  Stay up to date with your tetanus shots and other required immunizations. You should have a booster for tetanus every 10 years. Be sure to get your flu shot every year, since 5%-20% of the [place] population comes down with the flu. The flu vaccine changes each year, so being vaccinated once is not enough. Get your shot in the fall, before the flu season peaks.   Other vaccines to consider:  Human Papilloma Virus or HPV causes cancer of the cervix, and other infections that can be transmitted from person to person. There is a vaccine for HPV, and females should get immunized between the ages of 89 and 62. It requires a series of 3 shots.   Pneumococcal vaccine to protect against certain types of pneumonia.  This is normally recommended for adults age 72 or older.  However, adults younger than 60 years old with certain underlying conditions such as diabetes, heart or lung disease should also receive the vaccine.  Shingles vaccine to protect against Varicella Zoster if you are older than age 27, or younger than 60 years old with certain underlying illness.  If you have not had the Shingrix vaccine, please call your insurer to inquire about coverage for the Shingrix vaccine given in 2 doses.   Some insurers cover this vaccine after age 7, some cover this after age 77.  If your insurer covers this, then call to schedule appointment to have this vaccine here  Hepatitis A vaccine to protect against a form of infection of the liver by a virus acquired from food.  Hepatitis B vaccine to protect against a form of infection of the liver by a virus acquired from blood or  body fluids, particularly if you work in health care.  If you plan to travel internationally, check with your local health department for specific vaccination recommendations.  Cancer Screening:  Breast cancer screening is essential to preventive care for women. All women age 102 and older should perform a breast self-exam every month. At age 97 and older, women should have their caregiver complete a breast exam each year. Women at ages 109 and older should have a mammogram (x-ray film) of the breasts. Your caregiver can discuss how often you need mammograms.    Cervical  cancer screening includes taking a Pap smear (sample of cells examined under a microscope) from the cervix (end of the uterus). It also includes testing for HPV (Human Papilloma Virus, which can cause cervical cancer). Screening and a pelvic exam should begin at age 22, or 3 years after a woman becomes sexually active. Screening should occur every year, with a Pap smear but no HPV testing, up to age 17. After age 30, you should have a Pap smear every 3 years with HPV testing, if no HPV was found previously.   Most routine colon cancer screening begins at the age of 86. On a yearly basis, doctors may provide special easy to use take-home tests to check for hidden blood in the stool. Sigmoidoscopy or colonoscopy can detect the earliest forms of colon cancer and is life saving. These tests use a small camera at the end of a tube to directly examine the colon. Speak to your caregiver about this at age 41, when routine screening begins (and is repeated every 5 years unless early forms of pre-cancerous polyps or small growths are found).

## 2018-08-28 NOTE — Addendum Note (Signed)
Addended by: Gwinda Maine on: 08/28/2018 09:27 AM   Modules accepted: Orders

## 2018-08-28 NOTE — Addendum Note (Signed)
Addended by: Gwinda Maine on: 08/28/2018 11:41 AM   Modules accepted: Orders

## 2018-08-29 ENCOUNTER — Other Ambulatory Visit: Payer: Self-pay | Admitting: Medical

## 2018-08-29 LAB — BASIC METABOLIC PANEL
BUN / CREAT RATIO: 17 (ref 9–23)
BUN: 17 mg/dL (ref 6–24)
CHLORIDE: 102 mmol/L (ref 96–106)
CO2: 23 mmol/L (ref 20–29)
Calcium: 9.7 mg/dL (ref 8.7–10.2)
Creatinine, Ser: 1.03 mg/dL — ABNORMAL HIGH (ref 0.57–1.00)
GFR calc non Af Amer: 60 mL/min/{1.73_m2} (ref >59)
GFR, EST AFRICAN AMERICAN: 69 mL/min/{1.73_m2} (ref >59)
GLUCOSE: 103 mg/dL — AB (ref 65–99)
POTASSIUM: 4.2 mmol/L (ref 3.5–5.2)
SODIUM: 140 mmol/L (ref 134–144)

## 2018-08-29 LAB — LIPID PANEL
CHOLESTEROL TOTAL: 157 mg/dL (ref 100–199)
Chol/HDL Ratio: 2.6 ratio (ref 0.0–4.4)
HDL: 61 mg/dL (ref >39)
LDL Calculated: 86 mg/dL (ref 0–99)
Triglycerides: 48 mg/dL (ref 0–149)
VLDL Cholesterol Cal: 10 mg/dL (ref 5–40)

## 2018-08-29 LAB — CYTOLOGY - PAP: DIAGNOSIS: NEGATIVE

## 2018-08-29 LAB — TSH: TSH: 0.553 u[IU]/mL (ref 0.450–4.500)

## 2018-08-29 LAB — HIV ANTIBODY (ROUTINE TESTING W REFLEX): HIV SCREEN 4TH GENERATION: NONREACTIVE

## 2018-08-29 LAB — HEPATITIS C ANTIBODY: Hep C Virus Ab: <0.1 s/co ratio (ref 0.0–0.9)

## 2018-08-29 LAB — VITAMIN D 25 HYDROXY (VIT D DEFICIENCY, FRACTURES): VIT D 25 HYDROXY: 23.4 ng/mL — AB (ref 30.0–100.0)

## 2018-08-29 MED ORDER — VITAMIN D 1000 UNITS PO TABS: 1000.0000 [IU] | ORAL_TABLET | Freq: Every day | ORAL | 3 refills | Status: DC

## 2018-09-04 ENCOUNTER — Telehealth: Payer: Self-pay | Admitting: Medical

## 2018-09-04 NOTE — Telephone Encounter (Signed)
  Patient was advised to call her insurance to see if bone density was covered. Insurance company needs cpt code to be able to give her that information

## 2018-09-06 NOTE — Telephone Encounter (Signed)
Dx codes:Z78.0, E28.39 (estrogen deficiency, post menopausal)

## 2018-10-01 ENCOUNTER — Encounter: Payer: Self-pay | Admitting: Medical

## 2018-10-02 ENCOUNTER — Telehealth: Payer: Self-pay | Admitting: Medical

## 2018-10-02 NOTE — Telephone Encounter (Signed)
  [facility] tried to reach her about referral but can't get in touch with her.  Please inquire.

## 2018-10-02 NOTE — Telephone Encounter (Signed)
I have sent her a letter with [facility] address and phone info back in October 2019 and she did not respond.   I tried to call her and no answer and no voicemail to leave a message.

## 2019-04-09 ENCOUNTER — Encounter: Payer: Self-pay | Admitting: Gastroenterology

## 2019-05-12 ENCOUNTER — Ambulatory Visit: Payer: Self-pay | Admitting: *Deleted

## 2019-05-12 ENCOUNTER — Other Ambulatory Visit: Payer: Self-pay

## 2019-05-12 VITALS — Ht 63.0 in | Wt 144.0 lb

## 2019-05-12 DIAGNOSIS — Z1211 Encounter for screening for malignant neoplasm of colon: Secondary | ICD-10-CM

## 2019-05-12 MED ORDER — SUPREP BOWEL PREP KIT 17.5-3.13-1.6 GM/177ML PO SOLN: 1.0000 | Freq: Once | ORAL | 0 refills | Status: AC

## 2019-05-12 NOTE — Progress Notes (Signed)
No egg or soy allergy known to patient  No issues with past sedation with any surgeries  or procedures, no intubation problems  No diet pills per patient No home 02 use per patient  No blood thinners per patient  Pt denies issues with constipation  No A fib or A flutter  EMMI video sent to pt's e mail   Pt will e mail front and back of her insurance card today - she will e mail to [place] verified name, DOB, address and insurance during PV today. Pt mailed instruction packet to included paper to complete and mail back to [facility] with addressed and stamped envelope, Emmi video, copy of consent form to read and not return, and instructions. [employer] $15  coupon mailed in packet. PV completed over the phone. Pt encouraged to call with questions or issues   Pt is aware that care partner will wait in the car during proceudre; if they feel like they will be too hot to wait in the car; they may wait in the lobby.  We want them to wear a mask (we do not have any that we can provide them), practice social distancing, and we will check their temperatures when they get here.  I did remind patient that their care partner needs to stay in the parking lot the entire time. Pt will wear mask into building.

## 2019-05-23 ENCOUNTER — Telehealth: Payer: Self-pay | Admitting: Gastroenterology

## 2019-05-23 NOTE — Telephone Encounter (Signed)

## 2019-05-26 ENCOUNTER — Other Ambulatory Visit: Payer: Self-pay

## 2019-05-26 ENCOUNTER — Ambulatory Visit (AMBULATORY_SURGERY_CENTER): Payer: Commercial Managed Care - PPO | Admitting: Gastroenterology

## 2019-05-26 ENCOUNTER — Encounter: Payer: Self-pay | Admitting: Gastroenterology

## 2019-05-26 VITALS — BP 126/66 | HR 55 | Temp 97.5°F | Resp 14 | Ht 64.0 in | Wt 155.0 lb

## 2019-05-26 DIAGNOSIS — Z1211 Encounter for screening for malignant neoplasm of colon: Secondary | ICD-10-CM

## 2019-05-26 DIAGNOSIS — D124 Benign neoplasm of descending colon: Secondary | ICD-10-CM

## 2019-05-26 HISTORY — PX: COLONOSCOPY: SHX174

## 2019-05-26 MED ORDER — SODIUM CHLORIDE 0.9 % IV SOLN: 500.0000 mL | Freq: Once | INTRAVENOUS | Status: DC

## 2019-05-26 NOTE — Progress Notes (Signed)
Fayrene Fearing took temp and Rica Mote took vitals.

## 2019-05-26 NOTE — Progress Notes (Signed)
Pt's states no medical or surgical changes since previsit or office visit. 

## 2019-05-26 NOTE — Op Note (Signed)
[place] Patient Name: Ronnett Pullin Procedure Date: 05/26/2019 10:34 AM MRN: [id number] Endoscopist: Thornton Park MD, MD Age: 63 Referring MD:  Date of Birth: 01-22-1956 Gender: Female Account #: [id number] Procedure:                Colonoscopy Indications:              Screening for colorectal malignant neoplasm                           Normal screening colonoscopy 10 years ago                           No known family history of colon cancer or polyps                           No baseline GI symptoms Medicines:                See the Anesthesia note for documentation of the                            administered medications Procedure:                Pre-Anesthesia Assessment:                           - Prior to the procedure, a History and Physical                            was performed, and patient medications and                            allergies were reviewed. The patient's tolerance of                            previous anesthesia was also reviewed. The risks                            and benefits of the procedure and the sedation                            options and risks were discussed with the patient.                            All questions were answered, and informed consent                            was obtained. Prior Anticoagulants: The patient has                            taken no previous anticoagulant or antiplatelet                            agents. ASA Grade Assessment: II - A patient with  mild systemic disease. After reviewing the risks                            and benefits, the patient was deemed in                            satisfactory condition to undergo the procedure.                           After obtaining informed consent, the colonoscope                            was passed under direct vision. Throughout the                            procedure, the patient's blood pressure, pulse, and                             oxygen saturations were monitored continuously. The                            Colonoscope was introduced through the anus and                            advanced to the the terminal ileum, with                            identification of the appendiceal orifice and IC                            valve. A second forward view of the right colon was                            performed. The colonoscopy was performed without                            difficulty. The patient tolerated the procedure                            well. The quality of the bowel preparation was                            good. The terminal ileum, ileocecal valve,                            appendiceal orifice, and rectum were photographed. Scope In: 10:38:21 AM Scope Out: 10:53:20 AM Scope Withdrawal Time: 0 hours 10 minutes 35 seconds  Total Procedure Duration: 0 hours 14 minutes 59 seconds  Findings:                 The perianal and digital rectal examinations were                            normal. There is mild melanosis coli throughout  the                            colon.                           The entire examined colon appeared normal on direct                            and retroflexion views. No polyp or mass seen. Complications:            No immediate complications. Estimated Blood Loss:     Estimated blood loss: none. Impression:               - The entire examined colon is normal on direct and                            retroflexion views except for mild melanosis coli.                           - No specimens collected. Recommendation:           - Patient has a contact number available for                            emergencies. The signs and symptoms of potential                            delayed complications were discussed with the                            patient. Return to normal activities tomorrow.                            Written discharge instructions were  provided to the                            patient.                           - Resume regular diet today.                           - Continue present medications.                           - Repeat colonoscopy in 10 years for screening                            purposes. Thornton Park MD, MD 05/26/2019 11:00:26 AM This report has been signed electronically.

## 2019-05-26 NOTE — Progress Notes (Signed)
To pacu, VSS. Report to [email or web address] 

## 2019-05-26 NOTE — Addendum Note (Signed)
Addended by: Ernestine Conrad D on: 05/26/2019 11:50 AM   Modules accepted: Orders

## 2019-05-26 NOTE — Patient Instructions (Signed)
YOU HAD AN ENDOSCOPIC PROCEDURE TODAY AT THE [facility]:   Refer to the procedure report that was given to you for any specific questions about what was found during the examination.  If the procedure report does not answer your questions, please call your gastroenterologist to clarify.  If you requested that your care partner not be given the details of your procedure findings, then the procedure report has been included in a sealed envelope for you to review at your convenience later.  YOU SHOULD EXPECT: Some feelings of bloating in the abdomen. Passage of more gas than usual.  Walking can help get rid of the air that was put into your GI tract during the procedure and reduce the bloating. If you had a lower endoscopy (such as a colonoscopy or flexible sigmoidoscopy) you may notice spotting of blood in your stool or on the toilet paper. If you underwent a bowel prep for your procedure, you may not have a normal bowel movement for a few days.  Please Note:  You might notice some irritation and congestion in your nose or some drainage.  This is from the oxygen used during your procedure.  There is no need for concern and it should clear up in a day or so.  SYMPTOMS TO REPORT IMMEDIATELY:   Following lower endoscopy (colonoscopy or flexible sigmoidoscopy):  Excessive amounts of blood in the stool  Significant tenderness or worsening of abdominal pains  Swelling of the abdomen that is new, acute  Fever of 100F or higher  For urgent or emergent issues, a gastroenterologist can be reached at any hour by calling [phone].   DIET:  We do recommend a small meal at first, but then you may proceed to your regular diet.  Drink plenty of fluids but you should avoid alcoholic beverages for 24 hours.  ACTIVITY:  You should plan to take it easy for the rest of today and you should NOT DRIVE or use heavy machinery until tomorrow (because of the sedation medicines used during the test).     FOLLOW UP: Our staff will call the number listed on your records 48-72 hours following your procedure to check on you and address any questions or concerns that you may have regarding the information given to you following your procedure. If we do not reach you, we will leave a message.  We will attempt to reach you two times.  During this call, we will ask if you have developed any symptoms of COVID 19. If you develop any symptoms (ie: fever, flu-like symptoms, shortness of breath, cough etc.) before then, please call [phone].  If you test positive for Covid 19 in the 2 weeks post procedure, please call and report this information to us.    If any biopsies were taken you will be contacted by phone or by letter within the next 1-3 weeks.  Please call us at [phone] if you have not heard about the biopsies in 3 weeks.    SIGNATURES/CONFIDENTIALITY: You and/or your care partner have signed paperwork which will be entered into your electronic medical record.  These signatures attest to the fact that that the information above on your After Visit Summary has been reviewed and is understood.  Full responsibility of the confidentiality of this discharge information lies with you and/or your care-partner. 

## 2019-05-26 NOTE — Progress Notes (Signed)
Called to room to assist during endoscopic procedure.  Patient ID and intended procedure confirmed with present staff. Received instructions for my participation in the procedure from the performing physician.  

## 2019-05-28 ENCOUNTER — Telehealth: Payer: Self-pay

## 2019-05-28 NOTE — Telephone Encounter (Signed)
  Follow up Call-  Call back number 05/26/2019  Post procedure Call Back phone  # [phone]  Permission to leave phone message Yes  Some recent data might be hidden     Patient questions:  Do you have a fever, pain , or abdominal swelling? No. Pain Score  0 *  Have you tolerated food without any problems? Yes.    Have you been able to return to your normal activities? Yes.    Do you have any questions about your discharge instructions: Diet   No. Medications  No. Follow up visit  No.  Do you have questions or concerns about your Care? No.  Actions: * If pain score is 4 or above: No action needed, pain <4. 1. Have you developed a fever since your procedure? no  2.   Have you had an respiratory symptoms (SOB or cough) since your procedure? no  3.   Have you tested positive for COVID 19 since your procedure no  4.   Have you had any family members/close contacts diagnosed with the COVID 19 since your procedure?  no   If yes to any of these questions please route to Joylene John, RN and Alphonsa Gin, Therapist, sports.

## 2019-09-08 ENCOUNTER — Other Ambulatory Visit: Payer: Self-pay | Admitting: Medical

## 2019-09-08 DIAGNOSIS — Z1231 Encounter for screening mammogram for malignant neoplasm of breast: Secondary | ICD-10-CM

## 2019-09-16 ENCOUNTER — Ambulatory Visit (INDEPENDENT_AMBULATORY_CARE_PROVIDER_SITE_OTHER): Payer: Commercial Managed Care - PPO | Admitting: Medical

## 2019-09-16 ENCOUNTER — Other Ambulatory Visit: Payer: Self-pay

## 2019-09-16 ENCOUNTER — Encounter: Payer: Self-pay | Admitting: Medical

## 2019-09-16 VITALS — BP 130/82 | HR 65 | Temp 96.6°F | Ht 64.0 in | Wt 158.4 lb

## 2019-09-16 DIAGNOSIS — E2839 Other primary ovarian failure: Secondary | ICD-10-CM | POA: Diagnosis not present

## 2019-09-16 DIAGNOSIS — E559 Vitamin D deficiency, unspecified: Secondary | ICD-10-CM | POA: Diagnosis not present

## 2019-09-16 DIAGNOSIS — Z780 Asymptomatic menopausal state: Secondary | ICD-10-CM

## 2019-09-16 DIAGNOSIS — Z0000 Encounter for general adult medical examination without abnormal findings: Secondary | ICD-10-CM

## 2019-09-16 NOTE — Patient Instructions (Addendum)
Please call to schedule your bone density test on the same day as your mammogram.   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    [facility]  [phone], [place], [place] [address]    Thanks for trusting [place] with your health care and for coming in for a physical today.  Below are some general recommendations I have for you:  Yearly screenings See your eye doctor yearly for routine vision care. See your dentist yearly for routine dental care including hygiene visits twice yearly. See me here yearly for a routine physical and preventative care visit   Cancer screening Colon cancer screening:  You are up to date on colonoscopy screening.        I have included other useful information below for your review.  Preventative Care for Adults - Female      MAINTAIN REGULAR HEALTH EXAMS:  A routine yearly physical is a good way to check in with your primary care provider about your health and preventive screening. It is also an opportunity to share updates about your health and any concerns you have, and receive a thorough all-over exam.   Most health insurance companies pay for at least some preventative services.  Check with your health plan for specific coverages.  WHAT PREVENTATIVE SERVICES DO WOMEN NEED?  Adult women should have their weight and blood pressure checked regularly.   Women age 49 and older should have their cholesterol levels checked regularly.  Women should be screened for cervical cancer with a Pap smear and pelvic exam beginning at either age 31, or 3 years after they become sexually activity.    Breast cancer screening generally begins at age 62 with a mammogram and breast exam by your primary care provider.    Beginning at age 57 and continuing to age 61, women should be screened for  colorectal cancer.  Certain people may need continued testing until age 55.  Updating vaccinations is part of preventative care.  Vaccinations help protect against diseases such as the flu.  Osteoporosis is a disease in which the bones lose minerals and strength as we age. Women ages 36 and over should discuss this with their caregivers, as should women after menopause who have other risk factors.  Lab tests are generally done as part of preventative care to screen for anemia and blood disorders, to screen for problems with the kidneys and liver, to screen for bladder problems, to check blood sugar, and to check your cholesterol level.  Preventative services generally include counseling about diet, exercise, avoiding tobacco, drugs, excessive alcohol consumption, and sexually transmitted infections.    GENERAL RECOMMENDATIONS FOR GOOD HEALTH:  Healthy diet:  Eat a variety of foods, including fruit, vegetables, animal or vegetable protein, such as meat, fish, chicken, and eggs, or beans, lentils, tofu, and grains, such as rice.  Drink plenty of water daily.  Decrease saturated fat in the diet, avoid lots of red meat, processed foods, sweets, fast foods, and fried foods.  Exercise:  Aerobic exercise helps maintain good heart health. At least 30-40 minutes of moderate-intensity exercise is recommended. For example, a brisk walk that increases your heart rate and breathing. This should be done on most days of the week.   Find a type of exercise or a variety of exercises that you enjoy so that it becomes a part  of your daily life.  Examples are running, walking, swimming, water aerobics, and biking.  For motivation and support, explore group exercise such as aerobic class, spin class, Zumba, Yoga,or  martial arts, etc.    Set exercise goals for yourself, such as a certain weight goal, walk or run in a race such as a 5k walk/run.  Speak to your primary care provider about exercise  goals.  Disease prevention:  If you smoke or chew tobacco, find out from your caregiver how to quit. It can literally save your life, no matter how long you have been a tobacco user. If you do not use tobacco, never begin.   Maintain a healthy diet and normal weight. Increased weight leads to problems with blood pressure and diabetes.   The Body Mass Index or BMI is a way of measuring how much of your body is fat. Having a BMI above 27 increases the risk of heart disease, diabetes, hypertension, stroke and other problems related to obesity. Your caregiver can help determine your BMI and based on it develop an exercise and dietary program to help you achieve or maintain this important measurement at a healthful level.  High blood pressure causes heart and blood vessel problems.  Persistent high blood pressure should be treated with medicine if weight loss and exercise do not work.   Fat and cholesterol leaves deposits in your arteries that can block them. This causes heart disease and vessel disease elsewhere in your body.  If your cholesterol is found to be high, or if you have heart disease or certain other medical conditions, then you may need to have your cholesterol monitored frequently and be treated with medication.   Ask if you should have a cardiac stress test if your history suggests this. A stress test is a test done on a treadmill that looks for heart disease. This test can find disease prior to there being a problem.  Menopause can be associated with physical symptoms and risks. Hormone replacement therapy is available to decrease these. You should talk to your caregiver about whether starting or continuing to take hormones is right for you.   Osteoporosis is a disease in which the bones lose minerals and strength as we age. This can result in serious bone fractures. Risk of osteoporosis can be identified using a bone density scan. Women ages 67 and over should discuss this with their  caregivers, as should women after menopause who have other risk factors. Ask your caregiver whether you should be taking a calcium supplement and Vitamin D, to reduce the rate of osteoporosis.   Avoid drinking alcohol in excess (more than two drinks per day).  Avoid use of street drugs. Do not share needles with anyone. Ask for professional help if you need assistance or instructions on stopping the use of alcohol, cigarettes, and/or drugs.  Brush your teeth twice a day with fluoride toothpaste, and floss once a day. Good oral hygiene prevents tooth decay and gum disease. The problems can be painful, unattractive, and can cause other health problems. Visit your dentist for a routine oral and dental check up and preventive care every 6-12 months.   Look at your skin regularly.  Use a mirror to look at your back. Notify your caregivers of changes in moles, especially if there are changes in shapes, colors, a size larger than a pencil eraser, an irregular border, or development of new moles.  Safety:  Use seatbelts 100% of the time, whether driving or as  a passenger.  Use safety devices such as hearing protection if you work in environments with loud noise or significant background noise.  Use safety glasses when doing any work that could send debris in to the eyes.  Use a helmet if you ride a bike or motorcycle.  Use appropriate safety gear for contact sports.  Talk to your caregiver about gun safety.  Use sunscreen with a SPF (or skin protection factor) of 15 or greater.  Lighter skinned people are at a greater risk of skin cancer. Don't forget to also wear sunglasses in order to protect your eyes from too much damaging sunlight. Damaging sunlight can accelerate cataract formation.   Practice safe sex. Use condoms. Condoms are used for birth control and to help reduce the spread of sexually transmitted infections (or STIs).  Some of the STIs are gonorrhea (the clap), chlamydia, syphilis, trichomonas,  herpes, HPV (human papilloma virus) and HIV (human immunodeficiency virus) which causes AIDS. The herpes, HIV and HPV are viral illnesses that have no cure. These can result in disability, cancer and death.   Keep carbon monoxide and smoke detectors in your home functioning at all times. Change the batteries every 6 months or use a model that plugs into the wall.   Vaccinations:  Stay up to date with your tetanus shots and other required immunizations. You should have a booster for tetanus every 10 years. Be sure to get your flu shot every year, since 5%-20% of the [place] population comes down with the flu. The flu vaccine changes each year, so being vaccinated once is not enough. Get your shot in the fall, before the flu season peaks.   Other vaccines to consider:  Human Papilloma Virus or HPV causes cancer of the cervix, and other infections that can be transmitted from person to person. There is a vaccine for HPV, and females should get immunized between the ages of 5 and 91. It requires a series of 3 shots.   Pneumococcal vaccine to protect against certain types of pneumonia.  This is normally recommended for adults age 69 or older.  However, adults younger than 61 years old with certain underlying conditions such as diabetes, heart or lung disease should also receive the vaccine.  Shingles vaccine to protect against Varicella Zoster if you are older than age 29, or younger than 61 years old with certain underlying illness.  If you have not had the Shingrix vaccine, please call your insurer to inquire about coverage for the Shingrix vaccine given in 2 doses.   Some insurers cover this vaccine after age 21, some cover this after age 47.  If your insurer covers this, then call to schedule appointment to have this vaccine here  Hepatitis A vaccine to protect against a form of infection of the liver by a virus acquired from food.  Hepatitis B vaccine to protect against a form of infection of the  liver by a virus acquired from blood or body fluids, particularly if you work in health care.  If you plan to travel internationally, check with your local health department for specific vaccination recommendations.  Cancer Screening:  Breast cancer screening is essential to preventive care for women. All women age 64 and older should perform a breast self-exam every month. At age 48 and older, women should have their caregiver complete a breast exam each year. Women at ages 19 and older should have a mammogram (x-ray film) of the breasts. Your caregiver can discuss how often you need  mammograms.    Cervical cancer screening includes taking a Pap smear (sample of cells examined under a microscope) from the cervix (end of the uterus). It also includes testing for HPV (Human Papilloma Virus, which can cause cervical cancer). Screening and a pelvic exam should begin at age 52, or 3 years after a woman becomes sexually active. Screening should occur every year, with a Pap smear but no HPV testing, up to age 51. After age 22, you should have a Pap smear every 3 years with HPV testing, if no HPV was found previously.   Most routine colon cancer screening begins at the age of 28. On a yearly basis, doctors may provide special easy to use take-home tests to check for hidden blood in the stool. Sigmoidoscopy or colonoscopy can detect the earliest forms of colon cancer and is life saving. These tests use a small camera at the end of a tube to directly examine the colon. Speak to your caregiver about this at age 63, when routine screening begins (and is repeated every 5 years unless early forms of pre-cancerous polyps or small growths are found).

## 2019-09-16 NOTE — Progress Notes (Signed)
Subjective:   HPI  Megan Mccoy is a 63 y.o. female who presents for Chief Complaint  Patient presents with  . Annual Exam    with fasting labs     Medical care team includes: Puanani Gene, Camelia Eng, PA-C here for primary care Dentist Eye doctor  Concerns: None, doing well.   Her middle brother was recently diagnosed with brain and lung cancer.   He is a smoker.  Hx/o 2 pregnancies with live births, no current bleeding, hx/o fibroid surgery.     Reviewed their medical, surgical, family, social, medication, and allergy history and updated chart as appropriate.  Past Medical History:  Diagnosis Date  . Allergy   . Vitamin D deficiency 2019    Past Surgical History:  Procedure Laterality Date  . BREAST EXCISIONAL BIOPSY Left   . COLONOSCOPY     age 85yo  . COLONOSCOPY  05/26/2019   mild melanosis coli, otherwise normal.   Dr. Thornton Park  . TUBAL LIGATION    . UTERINE FIBROID SURGERY      Social History   Socioeconomic History  . Marital status: Married    Spouse name: Not on file  . Number of children: Not on file  . Years of education: Not on file  . Highest education level: Not on file  Occupational History  . Not on file  Social Needs  . Financial resource strain: Not on file  . Food insecurity    Worry: Not on file    Inability: Not on file  . Transportation needs    Medical: Not on file    Non-medical: Not on file  Tobacco Use  . Smoking status: Never Smoker  . Smokeless tobacco: Never Used  Substance and Sexual Activity  . Alcohol use: Yes    Alcohol/week: 2.0 standard drinks    Types: 2 Glasses of wine per week  . Drug use: Never  . Sexual activity: Not on file  Lifestyle  . Physical activity    Days per week: Not on file    Minutes per session: Not on file  . Stress: Not on file  Relationships  . Social Herbalist on phone: Not on file    Gets together: Not on file    Attends religious service: Not on file    Active  member of club or organization: Not on file    Attends meetings of clubs or organizations: Not on file    Relationship status: Not on file  . Intimate partner violence    Fear of current or ex partner: Not on file    Emotionally abused: Not on file    Physically abused: Not on file    Forced sexual activity: Not on file  Other Topics Concern  . Not on file  Social History Narrative   Married, 2 children, ages 5yo and 75yo, 2 grandchildren.   Works at [employer], does traffic commercials, exercise - zumba, walking, running, goes to [employer].   09/2019    Family History  Problem Relation Age of Onset  . Diabetes Mother   . Heart disease Mother 63       MI  . Cancer Father        lung  . Lung cancer Father   . Colon polyps Sister   . Cancer Brother        brain and lung, prior smoker  . Cancer Brother        lung, smoker  .  Lung cancer Brother   . Heart disease Brother        died in 34s of hole in the heart  . Breast cancer Paternal Grandmother   . Colon cancer Neg Hx   . Esophageal cancer Neg Hx   . Rectal cancer Neg Hx   . Stomach cancer Neg Hx      Current Outpatient Medications:  .  cholecalciferol (VITAMIN D) 1000 units tablet, Take 1 tablet (1,000 Units total) by mouth daily., Disp: 90 tablet, Rfl: 3 .  Multiple Vitamins-Minerals (MULTIVITAMIN WITH MINERALS) tablet, Take 1 tablet by mouth daily., Disp: , Rfl:   No Known Allergies   Review of Systems Constitutional: -fever, -chills, -sweats, -unexpected weight change, -decreased appetite, -fatigue Allergy: -sneezing, -itching, -congestion Dermatology: -changing moles, --rash, -lumps ENT: -runny nose, -ear pain, -sore throat, -hoarseness, -sinus pain, -teeth pain, - ringing in ears, -hearing loss, -nosebleeds Cardiology: -chest pain, -palpitations, -swelling, -difficulty breathing when lying flat, -waking up short of breath Respiratory: -cough, -shortness of breath, -difficulty breathing with exercise or exertion,  -wheezing, -coughing up blood Gastroenterology: -abdominal pain, -nausea, -vomiting, -diarrhea, -constipation, -blood in stool, -changes in bowel movement, -difficulty swallowing or eating Hematology: -bleeding, -bruising  Musculoskeletal: -joint aches, -muscle aches, -joint swelling, -back pain, -neck pain, -cramping, -changes in gait Ophthalmology: denies vision changes, eye redness, itching, discharge Urology: -burning with urination, -difficulty urinating, -blood in urine, -urinary frequency, -urgency, -incontinence Neurology: -headache, -weakness, -tingling, -numbness, -memory loss, -falls, -dizziness Psychology: -depressed mood, -agitation, -sleep problems Breast/gyn: -breast tenderness, -discharge, -lumps, -vaginal discharge,- irregular periods, -heavy periods     Objective:  BP 130/82   Pulse 65   Temp (!) 96.6 F (35.9 C)   Ht 5\' 4"  (1.626 m)   Wt 158 lb 6.4 oz (71.8 kg)   SpO2 99%   BMI 27.19 kg/m   General appearance: alert, no distress, WD/WN,lean African American female Skin: left lower medial leg with 5cm x 2.5cm flat birth mark, no worrisome lesions HEENT: normocephalic, conjunctiva/corneas normal, sclerae anicteric, PERRLA, EOMi, nares patent, no discharge or erythema, pharynx normal Oral cavity: MMM, tongue normal, teeth in good repair Neck: supple, no lymphadenopathy, no thyromegaly, no masses, normal ROM, no bruits Chest: non tender, normal shape and expansion Heart: RRR, normal S1, S2, no murmurs Lungs: CTA bilaterally, no wheezes, rhonchi, or rales Abdomen: +bs, soft, non tender, non distended, no masses, no hepatomegaly, no splenomegaly, no bruits Back: non tender, normal ROM, no scoliosis Musculoskeletal: upper extremities non tender, no obvious deformity, normal ROM throughout, lower extremities non tender, no obvious deformity, normal ROM throughout Extremities: no edema, no cyanosis, no clubbing Pulses: 2+ symmetric, upper and lower extremities, normal cap  refill Neurological: alert, oriented x 3, CN2-12 intact, strength normal upper extremities and lower extremities, sensation normal throughout, DTRs 2+ throughout, no cerebellar signs, gait normal Psychiatric: normal affect, behavior normal, pleasant  Breast/gyn/rectal - deferred at patient request    Assessment and Plan :   Encounter Diagnoses  Name Primary?  . Encounter for health maintenance examination in adult Yes  . Estrogen deficiency   . Post-menopausal   . Vitamin D deficiency     Physical exam - discussed and counseled on healthy lifestyle, diet, exercise, preventative care, vaccinations, sick and well care, proper use of emergency dept and after hours care, and addressed their concerns.    Health screening: Bone density - recommended bone density evaluation due to risk factors postmenopausal estrogen deficiency See your eye doctor yearly for routine vision care. See  your dentist yearly for routine dental care including hygiene visits twice yearly.  Cancer screening She is scheduled for mammogram next month Reviewed pap from 2019 Reviewed colonoscopy from 05/2019   Vaccinations: Will request recent flu and 2nd shingrix she had done at her employer office, and request 2019 pneumococcal vaccine she had done at employer   Separate significant chronic issues discussed: Vit D deficiency - c/t supplement  C/t aerobic and weight bearing exercise    Sula was seen today for annual exam.  Diagnoses and all orders for this visit:  Encounter for health maintenance examination in adult -     Comprehensive metabolic panel -     CBC -     Vitamin D, 25-hydroxy -     Lipid Panel -     DG Bone Density; Future  Estrogen deficiency -     DG Bone Density; Future  Post-menopausal -     DG Bone Density; Future  Vitamin D deficiency -     Vitamin D, 25-hydroxy -     DG Bone Density; Future   Follow-up pending labs, yearly for physical

## 2019-09-17 ENCOUNTER — Other Ambulatory Visit: Payer: Self-pay | Admitting: Medical

## 2019-09-17 LAB — COMPREHENSIVE METABOLIC PANEL
ALT: 15 IU/L (ref 0–32)
AST: 21 IU/L (ref 0–40)
Albumin/Globulin Ratio: 1.7 (ref 1.2–2.2)
Albumin: 5.1 g/dL — ABNORMAL HIGH (ref 3.8–4.9)
Alkaline Phosphatase: 60 IU/L (ref 39–117)
BUN/Creatinine Ratio: 17 (ref 12–28)
BUN: 17 mg/dL (ref 8–27)
Bilirubin Total: 0.3 mg/dL (ref 0.0–1.2)
CO2: 23 mmol/L (ref 20–29)
Calcium: 9.7 mg/dL (ref 8.7–10.3)
Chloride: 103 mmol/L (ref 96–106)
Creatinine, Ser: 0.99 mg/dL (ref 0.57–1.00)
GFR calc Af Amer: 72 mL/min/{1.73_m2} (ref >59)
GFR calc non Af Amer: 62 mL/min/{1.73_m2} (ref >59)
Globulin, Total: 3 g/dL (ref 1.5–4.5)
Glucose: 89 mg/dL (ref 65–99)
Potassium: 4.6 mmol/L (ref 3.5–5.2)
Sodium: 139 mmol/L (ref 134–144)
Total Protein: 8.1 g/dL (ref 6.0–8.5)

## 2019-09-17 LAB — CBC
Hematocrit: 44.7 % (ref 34.0–46.6)
Hemoglobin: 14.4 g/dL (ref 11.1–15.9)
MCH: 27 pg (ref 26.6–33.0)
MCHC: 32.2 g/dL (ref 31.5–35.7)
MCV: 84 fL (ref 79–97)
Platelets: 236 10*3/uL (ref 150–450)
RBC: 5.34 x10E6/uL — ABNORMAL HIGH (ref 3.77–5.28)
RDW: 13.1 % (ref 11.7–15.4)
WBC: 4.1 10*3/uL (ref 3.4–10.8)

## 2019-09-17 LAB — LIPID PANEL
Chol/HDL Ratio: 3 ratio (ref 0.0–4.4)
Cholesterol, Total: 166 mg/dL (ref 100–199)
HDL: 55 mg/dL (ref >39)
LDL Chol Calc (NIH): 102 mg/dL — ABNORMAL HIGH (ref 0–99)
Triglycerides: 45 mg/dL (ref 0–149)
VLDL Cholesterol Cal: 9 mg/dL (ref 5–40)

## 2019-09-17 LAB — VITAMIN D 25 HYDROXY (VIT D DEFICIENCY, FRACTURES): Vit D, 25-Hydroxy: 31.5 ng/mL (ref 30.0–100.0)

## 2019-09-17 MED ORDER — VITAMIN D 25 MCG (1000 UNIT) PO TABS: 1000.0000 [IU] | ORAL_TABLET | Freq: Every day | ORAL | 3 refills | Status: DC

## 2019-09-23 ENCOUNTER — Other Ambulatory Visit: Payer: Self-pay

## 2019-09-23 DIAGNOSIS — Z20822 Contact with and (suspected) exposure to covid-19: Secondary | ICD-10-CM

## 2019-09-25 LAB — NOVEL CORONAVIRUS, NAA: SARS-CoV-2, NAA: NOT DETECTED

## 2019-10-29 ENCOUNTER — Ambulatory Visit
Admission: RE | Admit: 2019-10-29 | Discharge: 2019-10-29 | Disposition: A | Discharge status: Home / Self Care | Payer: Commercial Managed Care - PPO | Source: Ambulatory Visit | Attending: Medical | Admitting: Medical

## 2019-10-29 ENCOUNTER — Other Ambulatory Visit: Payer: Self-pay

## 2019-10-29 DIAGNOSIS — Z1231 Encounter for screening mammogram for malignant neoplasm of breast: Secondary | ICD-10-CM

## 2019-12-02 ENCOUNTER — Ambulatory Visit
Admission: RE | Admit: 2019-12-02 | Discharge: 2019-12-02 | Disposition: A | Discharge status: Home / Self Care | Payer: Commercial Managed Care - PPO | Source: Ambulatory Visit | Attending: Medical | Admitting: Medical

## 2019-12-02 ENCOUNTER — Other Ambulatory Visit: Payer: Self-pay

## 2019-12-02 DIAGNOSIS — E2839 Other primary ovarian failure: Secondary | ICD-10-CM

## 2019-12-02 DIAGNOSIS — Z780 Asymptomatic menopausal state: Secondary | ICD-10-CM

## 2019-12-02 DIAGNOSIS — E559 Vitamin D deficiency, unspecified: Secondary | ICD-10-CM

## 2019-12-02 DIAGNOSIS — Z0000 Encounter for general adult medical examination without abnormal findings: Secondary | ICD-10-CM

## 2019-12-05 ENCOUNTER — Ambulatory Visit: Payer: Commercial Managed Care - PPO | Attending: Internal Medicine

## 2019-12-05 DIAGNOSIS — Z20822 Contact with and (suspected) exposure to covid-19: Secondary | ICD-10-CM

## 2019-12-06 LAB — NOVEL CORONAVIRUS, NAA: SARS-CoV-2, NAA: NOT DETECTED

## 2019-12-11 ENCOUNTER — Telehealth: Payer: Self-pay | Admitting: Medical

## 2019-12-11 NOTE — Telephone Encounter (Signed)
Called pt's pharmacy in reference to a medical records request sent to them. Per pharmacy no records available.

## 2020-01-10 ENCOUNTER — Ambulatory Visit: Payer: Commercial Managed Care - PPO | Attending: Internal Medicine

## 2020-01-10 DIAGNOSIS — Z23 Encounter for immunization: Secondary | ICD-10-CM | POA: Insufficient documentation

## 2020-01-10 NOTE — Progress Notes (Signed)
   Covid-19 Vaccination Clinic  Name:  Blaze Domitrovich    MRN: [id number] DOB: Dec 05, 1955  01/10/2020  Ms. Capo was observed post Covid-19 immunization for 15 minutes without incident. She was provided with Vaccine Information Sheet and instruction to access the V-Safe system.   Ms. Gladys was instructed to call 911 with any severe reactions post vaccine: Marland Kitchen Difficulty breathing  . Swelling of face and throat  . A fast heartbeat  . A bad rash all over body  . Dizziness and weakness   Immunizations Administered    Name Date Dose VIS Date Route   Pfizer COVID-19 Vaccine 01/10/2020  9:13 AM 0.3 mL 10/17/2019 Intramuscular   Manufacturer: JAARS   Lot: [id number]   [place]: [id number]

## 2020-01-31 ENCOUNTER — Ambulatory Visit: Payer: Commercial Managed Care - PPO | Attending: Internal Medicine

## 2020-01-31 DIAGNOSIS — Z23 Encounter for immunization: Secondary | ICD-10-CM

## 2020-01-31 NOTE — Progress Notes (Signed)
   Covid-19 Vaccination Clinic  Name:  Megan Mccoy    MRN: [id number] DOB: [birth year]  01/31/2020  [name] was observed post Covid-19 immunization for 15 minutes without incident. She was provided with Vaccine Information Sheet and instruction to access the V-Safe system.   [name] was instructed to call 911 with any severe reactions post vaccine: Marland Kitchen Difficulty breathing  . Swelling of face and throat  . A fast heartbeat  . A bad rash all over body  . Dizziness and weakness   Immunizations Administered    Name Date Dose VIS Date Route   Pfizer COVID-19 Vaccine 01/31/2020  9:22 AM 0.3 mL 10/17/2019 Intramuscular   Manufacturer: [place]   Lot: [id number]   [place]: [id number]

## 2020-06-15 ENCOUNTER — Other Ambulatory Visit: Payer: Self-pay

## 2020-06-15 ENCOUNTER — Ambulatory Visit: Payer: Commercial Managed Care - PPO | Admitting: Family Medicine

## 2020-06-15 ENCOUNTER — Encounter: Payer: Self-pay | Admitting: Family Medicine

## 2020-06-15 VITALS — BP 148/88 | HR 68 | Temp 97.9°F | Wt 159.4 lb

## 2020-06-15 DIAGNOSIS — M25511 Pain in right shoulder: Secondary | ICD-10-CM | POA: Diagnosis not present

## 2020-06-15 NOTE — Patient Instructions (Signed)
4 ibuprofen 3 times per day.  You can also take 2 Tylenol 4 times per day for the pain

## 2020-06-15 NOTE — Progress Notes (Signed)
   Subjective:    Patient ID: Megan Mccoy, female    DOB: 21-Mar-1956, 64 y.o.   MRN: [id number]  HPI She states that on Saturday she noted the onset of right shoulder pain.  No history of injury or overuse.  She also complains of a tingling sensation in all of her fingers.  She did try Tylenol with no relief and did say that 2 ibuprofen did get some relief.  She has had a mammogram as well as colonoscopy.  She does take multivitamins and extra vitamin D.   Review of Systems     Objective:   Physical Exam Could not move her shoulder due to pain.  She did complain of pain to touching anywhere near the shoulder.       Assessment & Plan:  Acute pain of right shoulder I explained that I did not have a good idea as to what is going on here.  Explained that we need to get her pain under better control and then move forward with a more thorough evaluation.  Recommend 800 3 times daily of ibuprofen as well as to Tylenol 4 times per day.  Hopefully this will quiet her down and later this week over the next week and can then reevaluate.  She was comfortable with that.

## 2020-06-21 ENCOUNTER — Ambulatory Visit: Payer: Commercial Managed Care - PPO | Admitting: Medical

## 2020-06-21 ENCOUNTER — Other Ambulatory Visit: Payer: Self-pay

## 2020-06-21 ENCOUNTER — Encounter: Payer: Self-pay | Admitting: Medical

## 2020-06-21 VITALS — BP 118/70 | HR 64 | Ht 64.0 in | Wt 159.2 lb

## 2020-06-21 DIAGNOSIS — M7551 Bursitis of right shoulder: Secondary | ICD-10-CM | POA: Diagnosis not present

## 2020-06-21 DIAGNOSIS — M25511 Pain in right shoulder: Secondary | ICD-10-CM | POA: Insufficient documentation

## 2020-06-21 DIAGNOSIS — M62830 Muscle spasm of back: Secondary | ICD-10-CM

## 2020-06-21 NOTE — Progress Notes (Signed)
Subjective: Chief Complaint  Patient presents with  . Follow-up    right shoudler-was seen last week-feels better    Here for shoulder pain on the right.  Denies injury fall or trauma.  Started hurting last weekend.  She came in last week, saw Dr. Redmond School for the same issue. Apparently her range of motion was so limited due to pain last week she was advised to use Tylenol and ibuprofen alternately, ice, rest and return in a week to reassess.   Thus she is here for reassessment. She notes over the course of the last week it has gotten much better. She now can move her arm over her head. She denies chronic shoulder pain. No neck pain. No specific numbness weakness or tingling. She has been resting her arm this past week. Prior to last week onset of symptoms she denied any particular injury or strenuous activity. She is right-handed. She does type on the computer all day. She works at a new station doing traffic detail.  No other aggravating or relieving factors. No other complaint.    Past Medical History:  Diagnosis Date  . Allergy   . Vitamin D deficiency 2019   Current Outpatient Medications on File Prior to Visit  Medication Sig Dispense Refill  . cholecalciferol (VITAMIN D3) 25 MCG (1000 UT) tablet Take 1 tablet (1,000 Units total) by mouth daily. 90 tablet 3  . Multiple Vitamins-Minerals (MULTIVITAMIN WITH MINERALS) tablet Take 1 tablet by mouth daily.    Marland Kitchen acetaminophen (TYLENOL) 500 MG tablet Take 500 mg by mouth every 6 (six) hours as needed. (Patient not taking: Reported on 06/21/2020)    . ibuprofen (ADVIL) 200 MG tablet Take 200 mg by mouth every 6 (six) hours as needed. (Patient not taking: Reported on 06/21/2020)     No current facility-administered medications on file prior to visit.   ROS as in subjective  Traffic tv broadcast   Objective: BP 118/70   Pulse 64   Ht 5\' 4"  (1.626 m)   Wt 159 lb 3.2 oz (72.2 kg)   SpO2 99%   BMI 27.33 kg/m   Gen: wd, wn, African-American  female Neck nontender, normal range of motion, no mass, no lymphadenopathy no thyromegaly Slight tenderness in right upper back paraspinal muscles and rhomboids, small spasm noted in the rhomboids, otherwise back nontender without deformity Musculoskeletal: Mild tenderness over right AC joint and biceps origin, mild pain in the same area with range of motion although range of motion is 90% of normal in respect to shoulder flexion and abduction internal and external rotation. She notes she was unable to even lift her arm a week ago. Otherwise nontender, no swelling, no deformity. She is using accessory shoulder girdle muscles with abduction of shoulder over 120 degrees. Otherwise range of motion normal. Nontender with apprehension and crossover test, no labral positive exam findings.    Mild pain with Hawkins.   Arms neurovascularly intact    Assessment: Encounter Diagnoses  Name Primary?  . Acute pain of right shoulder Yes  . Bursitis of right shoulder   . Back spasm      Plan: Symptoms and exam today much improved compared to last week. I suspect she had bursitis and possibly muscle spasm in the upper back. For the next week she will continue conservative treatment including relative rest, use of arm sling for 1 to 2 hours at a time, continue alternating Tylenol and ibuprofen for another 5 days, can continue ice or alternate ice and  heat therapy as discussed, his symptoms gradually should improve. If not improving over the next week or worsening, referral to sports medicine. Consider massage periodically for upper back spasm and tension  Christiann was seen today for follow-up.  Diagnoses and all orders for this visit:  Acute pain of right shoulder  Bursitis of right shoulder  Back spasm

## 2020-09-16 ENCOUNTER — Encounter: Payer: Self-pay | Admitting: Medical

## 2020-09-16 ENCOUNTER — Ambulatory Visit: Payer: Commercial Managed Care - PPO | Admitting: Medical

## 2020-09-16 ENCOUNTER — Other Ambulatory Visit: Payer: Self-pay

## 2020-09-16 VITALS — BP 126/72 | HR 69 | Ht 64.0 in | Wt 156.2 lb

## 2020-09-16 DIAGNOSIS — M7750 Other enthesopathy of unspecified foot: Secondary | ICD-10-CM

## 2020-09-16 DIAGNOSIS — Z1231 Encounter for screening mammogram for malignant neoplasm of breast: Secondary | ICD-10-CM | POA: Insufficient documentation

## 2020-09-16 DIAGNOSIS — Z0000 Encounter for general adult medical examination without abnormal findings: Secondary | ICD-10-CM

## 2020-09-16 DIAGNOSIS — E2839 Other primary ovarian failure: Secondary | ICD-10-CM

## 2020-09-16 DIAGNOSIS — Z780 Asymptomatic menopausal state: Secondary | ICD-10-CM | POA: Diagnosis not present

## 2020-09-16 DIAGNOSIS — E559 Vitamin D deficiency, unspecified: Secondary | ICD-10-CM

## 2020-09-16 DIAGNOSIS — M8580 Other specified disorders of bone density and structure, unspecified site: Secondary | ICD-10-CM | POA: Insufficient documentation

## 2020-09-16 NOTE — Progress Notes (Signed)
[facility] does spur removal. Referral has been sent.

## 2020-09-16 NOTE — Progress Notes (Addendum)
Subjective:   HPI  Megan Mccoy is a 64 y.o. female who presents for Chief Complaint  Patient presents with  . Annual Exam    with fasting labs   . Foot Problem    posisble bine spur left foot     Medical care team includes: Yoshi Mancillas, Camelia Eng, PA-C here for primary care Dentist Eye doctor   Concerns: Has bone spur right dorsal foot.  Had this years ago that was surgical removed.  Its back again, painful at times.   Hx/o 2 pregnancies with live births, no current bleeding, hx/o fibroid surgery.     Reviewed their medical, surgical, family, social, medication, and allergy history and updated chart as appropriate.  Past Medical History:  Diagnosis Date  . Allergy   . Vitamin D deficiency 2019    Past Surgical History:  Procedure Laterality Date  . BREAST EXCISIONAL BIOPSY Left   . COLONOSCOPY     age 27yo  . COLONOSCOPY  05/26/2019   mild melanosis coli, otherwise normal.   Dr. Thornton Park  . TUBAL LIGATION    . UTERINE FIBROID SURGERY       Family History  Problem Relation Age of Onset  . Diabetes Mother   . Heart disease Mother 30       MI  . Cancer Father        lung  . Lung cancer Father   . Colon polyps Sister   . Cancer Brother        brain and lung, prior smoker  . Cancer Brother        lung, smoker  . Lung cancer Brother   . Heart disease Brother        died in 18s of hole in the heart  . Breast cancer Paternal Grandmother   . Colon cancer Neg Hx   . Esophageal cancer Neg Hx   . Rectal cancer Neg Hx   . Stomach cancer Neg Hx      Current Outpatient Medications:  Marland Kitchen  Multiple Vitamins-Minerals (MULTIVITAMIN WITH MINERALS) tablet, Take 1 tablet by mouth daily. , Disp: , Rfl:  .  cholecalciferol (VITAMIN D3) 25 MCG (1000 UNIT) tablet, Take 2 tablets (2,000 Units total) by mouth daily., Disp: 180 tablet, Rfl: 3  No Known Allergies   Review of Systems Constitutional: -fever, -chills, -sweats, -unexpected weight change, -decreased  appetite, -fatigue Allergy: -sneezing, -itching, -congestion Dermatology: -changing moles, --rash, -lumps ENT: -runny nose, -ear pain, -sore throat, -hoarseness, -sinus pain, -teeth pain, - ringing in ears, -hearing loss, -nosebleeds Cardiology: -chest pain, -palpitations, -swelling, -difficulty breathing when lying flat, -waking up short of breath Respiratory: -cough, -shortness of breath, -difficulty breathing with exercise or exertion, -wheezing, -coughing up blood Gastroenterology: -abdominal pain, -nausea, -vomiting, -diarrhea, -constipation, -blood in stool, -changes in bowel movement, -difficulty swallowing or eating Hematology: -bleeding, -bruising  Musculoskeletal: -joint aches, -muscle aches, -joint swelling, -back pain, -neck pain, -cramping, -changes in gait Ophthalmology: denies vision changes, eye redness, itching, discharge Urology: -burning with urination, -difficulty urinating, -blood in urine, -urinary frequency, -urgency, -incontinence Neurology: -headache, -weakness, -tingling, -numbness, -memory loss, -falls, -dizziness Psychology: -depressed mood, -agitation, -sleep problems Breast/gyn: -breast tenderness, -discharge, -lumps, -vaginal discharge,- irregular periods, -heavy periods     Objective:  BP 126/72   Pulse 69   Ht 5\' 4"  (1.626 m)   Wt 156 lb 3.2 oz (70.9 kg)   SpO2 97%   BMI 26.81 kg/m   General appearance: alert, no distress, WD/WN,lean [place] American female  Skin: left lower medial leg with 5cm x 2.5cm flat birth mark, no worrisome lesions HEENT: normocephalic, conjunctiva/corneas normal, sclerae anicteric, PERRLA, EOMi, nares patent, no discharge or erythema, pharynx normal Oral cavity: MMM, tongue normal, teeth in good repair Neck: supple, no lymphadenopathy, no thyromegaly, no masses, normal ROM, no bruits Chest: non tender, normal shape and expansion Heart: RRR, normal S1, S2, no murmurs Lungs: CTA bilaterally, no wheezes, rhonchi, or  rales Abdomen: +bs, soft, non tender, non distended, no masses, no hepatomegaly, no splenomegaly, no bruits Back: non tender, normal ROM, no scoliosis Musculoskeletal: +bony spur dorsal foot left foot over lateral cuneiform region, tender, otherwise, upper extremities non tender, no obvious deformity, normal ROM throughout, lower extremities non tender, no obvious deformity, normal ROM throughout Extremities: no edema, no cyanosis, no clubbing Pulses: 2+ symmetric, upper and lower extremities, normal cap refill Neurological: alert, oriented x 3, CN2-12 intact, strength normal upper extremities and lower extremities, sensation normal throughout, DTRs 2+ throughout, no cerebellar signs, gait normal Psychiatric: normal affect, behavior normal, pleasant  Breast/gyn/rectal - deferred at patient request    Assessment and Plan :   Encounter Diagnoses  Name Primary?  . Encounter for health maintenance examination in adult Yes  . Estrogen deficiency   . Post-menopausal   . Vitamin D deficiency   . Encounter for screening mammogram for malignant neoplasm of breast   . Bone spur of foot   . Osteopenia, unspecified location     Physical exam - discussed and counseled on healthy lifestyle, diet, exercise, preventative care, vaccinations, sick and well care, proper use of emergency dept and after hours care, and addressed their concerns.    Health screening: See your eye doctor yearly for routine vision care. See your dentist yearly for routine dental care including hygiene visits twice yearly.  Cancer screening Counseled on breast exams, mammogram Reviewed pap from 2019 Reviewed colonoscopy from 05/2019 Plan pap with HPV 2022  Vaccinations: Will request copy of recent shingrix  She is up to date on td, flu, covid vaccines   Separate significant chronic issues discussed: Vit D deficiency - c/t supplement  C/t aerobic and weight bearing exercise  Osteopenia - counseled on need for  weight bearing and aerobic exercise, c/t vit supplement, discussed calcium intake . Repeat bone density scan 2023.   Heart disease screening - discussed family history.   Reviewed EKG on file.  Continue healthy lifestyle  Bone spur - referral for surgical consultation  Rhilyn was seen today for annual exam and foot problem.  Diagnoses and all orders for this visit:  Encounter for health maintenance examination in adult -     Lipid panel -     CBC with Differential/Platelet -     Comprehensive metabolic panel -     VITAMIN D 25 Hydroxy (Vit-D Deficiency, Fractures) -     MM DIGITAL SCREENING BILATERAL; Future  Estrogen deficiency  Post-menopausal  Vitamin D deficiency -     VITAMIN D 25 Hydroxy (Vit-D Deficiency, Fractures)  Encounter for screening mammogram for malignant neoplasm of breast -     MM DIGITAL SCREENING BILATERAL; Future  Bone spur of foot  Osteopenia, unspecified location   Follow-up pending labs, yearly for physical

## 2020-09-17 ENCOUNTER — Other Ambulatory Visit: Payer: Self-pay | Admitting: Medical

## 2020-09-17 LAB — VITAMIN D 25 HYDROXY (VIT D DEFICIENCY, FRACTURES): Vit D, 25-Hydroxy: 28.6 ng/mL — ABNORMAL LOW (ref 30.0–100.0)

## 2020-09-17 LAB — COMPREHENSIVE METABOLIC PANEL WITH GFR
ALT: 20 [IU]/L (ref 0–32)
AST: 15 [IU]/L (ref 0–40)
Albumin/Globulin Ratio: 1.4 (ref 1.2–2.2)
Albumin: 4.7 g/dL (ref 3.8–4.8)
Alkaline Phosphatase: 65 [IU]/L (ref 44–121)
BUN/Creatinine Ratio: 12 (ref 12–28)
BUN: 11 mg/dL (ref 8–27)
Bilirubin Total: 0.4 mg/dL (ref 0.0–1.2)
CO2: 24 mmol/L (ref 20–29)
Calcium: 9.9 mg/dL (ref 8.7–10.3)
Chloride: 101 mmol/L (ref 96–106)
Creatinine, Ser: 0.94 mg/dL (ref 0.57–1.00)
GFR calc Af Amer: 75 mL/min/{1.73_m2}
GFR calc non Af Amer: 65 mL/min/{1.73_m2}
Globulin, Total: 3.4 g/dL (ref 1.5–4.5)
Glucose: 82 mg/dL (ref 65–99)
Potassium: 4.2 mmol/L (ref 3.5–5.2)
Sodium: 140 mmol/L (ref 134–144)
Total Protein: 8.1 g/dL (ref 6.0–8.5)

## 2020-09-17 LAB — CBC WITH DIFFERENTIAL/PLATELET
Basophils Absolute: 0 10*3/uL (ref 0.0–0.2)
Basos: 0 %
EOS (ABSOLUTE): 0.1 10*3/uL (ref 0.0–0.4)
Eos: 1 %
Hematocrit: 43.1 % (ref 34.0–46.6)
Hemoglobin: 13.7 g/dL (ref 11.1–15.9)
Immature Grans (Abs): 0 10*3/uL (ref 0.0–0.1)
Immature Granulocytes: 0 %
Lymphocytes Absolute: 2.6 10*3/uL (ref 0.7–3.1)
Lymphs: 51 %
MCH: 27 pg (ref 26.6–33.0)
MCHC: 31.8 g/dL (ref 31.5–35.7)
MCV: 85 fL (ref 79–97)
Monocytes Absolute: 0.4 10*3/uL (ref 0.1–0.9)
Monocytes: 8 %
Neutrophils Absolute: 2 10*3/uL (ref 1.4–7.0)
Neutrophils: 40 %
Platelets: 235 10*3/uL (ref 150–450)
RBC: 5.07 x10E6/uL (ref 3.77–5.28)
RDW: 13.3 % (ref 11.7–15.4)
WBC: 5.1 10*3/uL (ref 3.4–10.8)

## 2020-09-17 LAB — LIPID PANEL
Chol/HDL Ratio: 3.3 ratio (ref 0.0–4.4)
Cholesterol, Total: 177 mg/dL (ref 100–199)
HDL: 54 mg/dL
LDL Chol Calc (NIH): 112 mg/dL — ABNORMAL HIGH (ref 0–99)
Triglycerides: 58 mg/dL (ref 0–149)
VLDL Cholesterol Cal: 11 mg/dL (ref 5–40)

## 2020-09-17 MED ORDER — VITAMIN D 25 MCG (1000 UNIT) PO TABS: 2000.0000 [IU] | ORAL_TABLET | Freq: Every day | ORAL | 3 refills | Status: DC

## 2020-10-06 HISTORY — PX: FOOT SURGERY: SHX648

## 2020-10-07 ENCOUNTER — Telehealth: Payer: Self-pay | Admitting: Medical

## 2020-10-07 NOTE — Telephone Encounter (Signed)
Received requested vaccine records from [facility]

## 2020-10-11 ENCOUNTER — Other Ambulatory Visit: Payer: Self-pay

## 2020-10-11 ENCOUNTER — Ambulatory Visit (INDEPENDENT_AMBULATORY_CARE_PROVIDER_SITE_OTHER): Payer: Commercial Managed Care - PPO

## 2020-10-11 ENCOUNTER — Encounter: Payer: Self-pay | Admitting: Podiatry

## 2020-10-11 ENCOUNTER — Ambulatory Visit: Payer: Commercial Managed Care - PPO | Admitting: Podiatry

## 2020-10-11 DIAGNOSIS — M7750 Other enthesopathy of unspecified foot: Secondary | ICD-10-CM | POA: Diagnosis not present

## 2020-10-11 DIAGNOSIS — M6740 Ganglion, unspecified site: Secondary | ICD-10-CM

## 2020-10-11 DIAGNOSIS — M79672 Pain in left foot: Secondary | ICD-10-CM | POA: Diagnosis not present

## 2020-10-11 DIAGNOSIS — M7752 Other enthesopathy of left foot: Secondary | ICD-10-CM

## 2020-10-11 NOTE — Patient Instructions (Signed)

## 2020-10-12 ENCOUNTER — Telehealth: Payer: Self-pay | Admitting: Podiatry

## 2020-10-12 NOTE — Telephone Encounter (Addendum)
DOS: 10/20/2020  Procedure: Tarsal Exostectomy Lt [phone])  [facility] Effective From 11/06/2018 -  Deductible: $500 with $0 met and $500 remaining. Out of Pocket: $1,600 with $20 met and $1,580 remaining. CoInsurance: 20% Copay:  Per  Evelena Asa no Referral or Prior Authorizations are required. Call Reference Number [id number]

## 2020-10-15 NOTE — Progress Notes (Signed)
Subjective:   Patient ID: Megan Mccoy, female   DOB: 65 y.o.   MRN: [id number]   HPI 64 year old female presents the office today for concerns of a large bone spur on the top of her left foot.  She states that she had surgery in this area about 10 years ago to remove the bone spur and has done well but has grown back causing quite a bit of discomfort.  She wants to consider surgery to have this removed as it is causing irritation and pain on a daily basis despite shoe modifications, offloading.   Review of Systems  All other systems reviewed and are negative.  Past Medical History:  Diagnosis Date  . Allergy   . Vitamin D deficiency 2019    Past Surgical History:  Procedure Laterality Date  . BREAST EXCISIONAL BIOPSY Left   . COLONOSCOPY     age 58yo  . COLONOSCOPY  05/26/2019   mild melanosis coli, otherwise normal.   Dr. Thornton [place]  . TUBAL LIGATION    . UTERINE FIBROID SURGERY       Current Outpatient Medications:  .  cholecalciferol (VITAMIN D3) 25 MCG (1000 UNIT) tablet, Take 2 tablets (2,000 Units total) by mouth daily., Disp: 180 tablet, Rfl: 3 .  Multiple Vitamins-Minerals (MULTIVITAMIN WITH MINERALS) tablet, Take 1 tablet by mouth daily. , Disp: , Rfl:   No Known Allergies       Objective:  Physical Exam  General: AAO x3, NAD  Dermatological: Skin is warm, dry and supple bilateral.  There are no open sores, no preulcerative lesions, no rash or signs of infection present.  Vascular: Dorsalis Pedis artery and Posterior Tibial artery pedal pulses are 2/4 bilateral with immedate capillary fill time.   Neruologic: Grossly intact via light touch bilateral..   Musculoskeletal: Large bone spur present the dorsal medial aspect the left foot.  Scar from the prior surgery is well-healed.  The area is tender with pressure.  There is a small cyst, likely ganglion cyst, to the area overlying the bone spur.  No other areas of discomfort.  MMT 5/5.  Gait:  Unassisted, Nonantalgic.       Assessment:   Exostosis left midfoot     Plan:  -Treatment options discussed including all alternatives, risks, and complications -Etiology of symptoms were discussed -X-rays were obtained and reviewed with the patient.  Large bone spur present of the dorsal midfoot.  There is no evidence of acute fracture. -We discussed with conservative as well as surgical treatment options.  After discussion she will also proceed with surgical intervention.  She has tried shoe modification, offloading and given the size of the lesion I do believe that surgery would be beneficial for her. -We will plan for left foot exostectomy. -The incision placement as well as the postoperative course was discussed with the patient. I discussed risks of the surgery which include, but not limited to, infection, bleeding, pain, swelling, need for further surgery, delayed or nonhealing, painful or ugly scar, numbness or sensation changes, over/under correction, recurrence, transfer lesions, further deformity, hardware failure, DVT/PE, loss of toe/foot. Patient understands these risks and wishes to proceed with surgery. The surgical consent was reviewed with the patient all 3 pages were signed. No promises or guarantees were given to the outcome of the procedure. All questions were answered to the best of my ability. Before the surgery the patient was encouraged to call the office if there is any further questions. The surgery will be performed  at the [facility] on an outpatient basis.  Trula Slade DPM

## 2020-10-20 ENCOUNTER — Other Ambulatory Visit: Payer: Self-pay | Admitting: Podiatry

## 2020-10-20 ENCOUNTER — Encounter: Payer: Self-pay | Admitting: Podiatry

## 2020-10-20 DIAGNOSIS — M7752 Other enthesopathy of left foot: Secondary | ICD-10-CM

## 2020-10-20 MED ORDER — PROMETHAZINE HCL 25 MG PO TABS: 25.0000 mg | ORAL_TABLET | Freq: Three times a day (TID) | ORAL | 0 refills | Status: DC | PRN

## 2020-10-20 MED ORDER — CEPHALEXIN 500 MG PO CAPS: 500.0000 mg | ORAL_CAPSULE | Freq: Three times a day (TID) | ORAL | 0 refills | Status: DC

## 2020-10-20 MED ORDER — HYDROCODONE-ACETAMINOPHEN 5-325 MG PO TABS
1.0000 | ORAL_TABLET | Freq: Four times a day (QID) | ORAL | 0 refills | Status: DC | PRN
Start: 2020-10-20 — End: 2020-10-20

## 2020-10-20 MED ORDER — HYDROCODONE-ACETAMINOPHEN 5-325 MG PO TABS
1.0000 | ORAL_TABLET | Freq: Four times a day (QID) | ORAL | 0 refills | Status: DC | PRN
Start: 2020-10-20 — End: 2021-09-19

## 2020-10-20 NOTE — Progress Notes (Signed)
Postop medications sent 

## 2020-10-22 ENCOUNTER — Telehealth: Payer: Self-pay | Admitting: *Deleted

## 2020-10-22 NOTE — Telephone Encounter (Signed)
Called and spoke with the patient and stated that I was calling to see how the patient was doing after surgery with Dr Jacqualyn Posey on 10/20/2020 and patient stated that she was doing ok and was sore and swelling and is icing and elevating and there is not any fever or chills and not any nausea and patient could wiggle her toes and I stated to call the office if any concerns or questions or could go to my chart. Lattie Haw

## 2020-10-25 ENCOUNTER — Ambulatory Visit (INDEPENDENT_AMBULATORY_CARE_PROVIDER_SITE_OTHER): Payer: Commercial Managed Care - PPO | Admitting: Podiatry

## 2020-10-25 ENCOUNTER — Ambulatory Visit (INDEPENDENT_AMBULATORY_CARE_PROVIDER_SITE_OTHER): Payer: Commercial Managed Care - PPO

## 2020-10-25 ENCOUNTER — Other Ambulatory Visit: Payer: Self-pay

## 2020-10-25 ENCOUNTER — Encounter: Payer: Self-pay | Admitting: Podiatry

## 2020-10-25 DIAGNOSIS — M7750 Other enthesopathy of unspecified foot: Secondary | ICD-10-CM

## 2020-10-25 DIAGNOSIS — M79672 Pain in left foot: Secondary | ICD-10-CM

## 2020-10-25 DIAGNOSIS — M7752 Other enthesopathy of left foot: Secondary | ICD-10-CM | POA: Diagnosis not present

## 2020-10-28 NOTE — Progress Notes (Signed)
Subjective: Megan Mccoy is a 64 y.o. is seen today in office s/p left foot tarsal exostectomy preformed on 10/20/2020.  She states she is doing well.  Continue occasional pain medication and dressing of the foot keep it iced and elevated.  Denies any systemic complaints such as fevers, chills, nausea, vomiting. No calf pain, chest pain, shortness of breath.   Objective: General: No acute distress, AAOx3  DP/PT pulses palpable 2/4, CRT < 3 sec to all digits.  Protective sensation intact. Motor function intact.  LEFT foot: Incision is well coapted without any evidence of dehiscence with sutures intact. There is no surrounding erythema, ascending cellulitis, fluctuance, crepitus, malodor, drainage/purulence. There is mild edema around the surgical site. There is minimal pain along the surgical site.  No other areas of tenderness to bilateral lower extremities.  No other open lesions or pre-ulcerative lesions.  No pain with calf compression, swelling, warmth, erythema.   Assessment and Plan:  Status post left foot exostectomy, doing well with no complications   -Treatment options discussed including all alternatives, risks, and complications -X-rays obtained reviewed.  Status post exostectomy.  No evidence of acute fracture. -Antibiotic ointment was applied followed by dressing.  Keep the dressing clean, dry, intact-patient is tolerating cam boot.  -Ice/elevation -Pain medication as needed. -Monitor for any clinical signs or symptoms of infection and DVT/PE and directed to call the office immediately should any occur or go to the ER. -Follow-up as scheduled or sooner if any problems arise. In the meantime, encouraged to call the office with any questions, concerns, change in symptoms.   Celesta Gentile, DPM

## 2020-11-04 ENCOUNTER — Ambulatory Visit (INDEPENDENT_AMBULATORY_CARE_PROVIDER_SITE_OTHER): Payer: Commercial Managed Care - PPO | Admitting: Podiatry

## 2020-11-04 ENCOUNTER — Other Ambulatory Visit: Payer: Self-pay

## 2020-11-04 DIAGNOSIS — M79672 Pain in left foot: Secondary | ICD-10-CM

## 2020-11-04 DIAGNOSIS — M7750 Other enthesopathy of unspecified foot: Secondary | ICD-10-CM

## 2020-11-04 NOTE — Progress Notes (Signed)
Subjective: Megan Mccoy is a 64 y.o. is seen today in office s/p left foot tarsal exostectomy preformed on 10/20/2020. Presents today for possible suture removal. She is not taking any pain medication. She has been wearing a surgical shoe. Denies any systemic complaints such as fevers, chills, nausea, vomiting. No calf pain, chest pain, shortness of breath.   Objective: General: No acute distress, AAOx3  DP/PT pulses palpable 2/4, CRT < 3 sec to all digits.  Protective sensation intact. Motor function intact.  LEFT foot: Incision is well coapted without any evidence of dehiscence with sutures intact. There is no surrounding erythema, ascending cellulitis, fluctuance, crepitus, malodor, drainage/purulence. There is still mild edema around the surgical site. There is minimal pain along the surgical site. No signs of infection, dehiscence.  No other areas of tenderness to bilateral lower extremities.  No other open lesions or pre-ulcerative lesions.  No pain with calf compression, swelling, warmth, erythema.   Assessment and Plan:  Status post left foot exostectomy, doing well with no complications   -Treatment options discussed including all alternatives, risks, and complications -Reviewed a few sutures today but left the other half intact. -Antibiotic ointment was applied followed by dressing.  Keep the dressing clean, dry, intact -Ice/elevation -Pain medication as needed. -Monitor for any clinical signs or symptoms of infection and DVT/PE and directed to call the office immediately should any occur or go to the ER. -Follow-up as scheduled or sooner if any problems arise. In the meantime, encouraged to call the office with any questions, concerns, change in symptoms.   Ovid Curd, DPM

## 2020-11-11 ENCOUNTER — Ambulatory Visit (INDEPENDENT_AMBULATORY_CARE_PROVIDER_SITE_OTHER): Payer: Commercial Managed Care - PPO | Admitting: Podiatry

## 2020-11-11 ENCOUNTER — Other Ambulatory Visit: Payer: Self-pay

## 2020-11-11 ENCOUNTER — Encounter: Payer: Self-pay | Admitting: Podiatry

## 2020-11-11 DIAGNOSIS — M79672 Pain in left foot: Secondary | ICD-10-CM

## 2020-11-11 DIAGNOSIS — M7750 Other enthesopathy of unspecified foot: Secondary | ICD-10-CM

## 2020-11-11 NOTE — Progress Notes (Signed)
Subjective: Megan Mccoy is a 65 y.o. is seen today in office s/p left foot tarsal exostectomy preformed on 10/20/2020.  She presents today for further suture removal.  She states that she has been keeping antibiotic ointment and a bandage on the incision.  She did bump it yesterday.  She has been in the boot.  She admits to icing elevating.  She is not taking any pain medication at this time. Denies any systemic complaints such as fevers, chills, nausea, vomiting. No calf pain, chest pain, shortness of breath.   Objective: General: No acute distress, AAOx3  DP/PT pulses palpable 2/4, CRT < 3 sec to all digits.  Protective sensation intact. Motor function intact.  LEFT foot: Incision is well coapted without any evidence of dehiscence with half of the sutures intact.  There is mild macerated tissue on the proximal aspect incision but there is no skin breakdown.  There is no surrounding erythema, ascending cellulitis there is no drainage or pus or signs of infection.  There is mild edema.  No other areas of tenderness to bilateral lower extremities.  No other open lesions or pre-ulcerative lesions.  No pain with calf compression, swelling, warmth, erythema.   Assessment and Plan:  Status post left foot exostectomy, doing well with no complications   -Treatment options discussed including all alternatives, risks, and complications -I removed the remainder of the sutures today without any complications.  I did apply Betadine to the incision followed by dry sterile dressing.  Discussed with her the use last antibiotic ointment and to keep the area more clean and dry to avoid macerated tissue.  Continue ice elevation.  Cam boot for now.  We will extend her out of work for at least 2 more weeks to work from home.  She feels able she can start to transition back into regular shoe as tolerated  Vivi Barrack DPM

## 2020-11-18 ENCOUNTER — Encounter: Payer: Commercial Managed Care - PPO | Admitting: Podiatry

## 2020-11-25 ENCOUNTER — Ambulatory Visit (INDEPENDENT_AMBULATORY_CARE_PROVIDER_SITE_OTHER): Payer: Commercial Managed Care - PPO | Admitting: Podiatry

## 2020-11-25 ENCOUNTER — Ambulatory Visit (INDEPENDENT_AMBULATORY_CARE_PROVIDER_SITE_OTHER): Payer: Commercial Managed Care - PPO

## 2020-11-25 ENCOUNTER — Other Ambulatory Visit: Payer: Self-pay

## 2020-11-25 DIAGNOSIS — M79672 Pain in left foot: Secondary | ICD-10-CM

## 2020-11-25 DIAGNOSIS — M7750 Other enthesopathy of unspecified foot: Secondary | ICD-10-CM

## 2020-11-25 DIAGNOSIS — M7752 Other enthesopathy of left foot: Secondary | ICD-10-CM | POA: Diagnosis not present

## 2020-11-30 NOTE — Progress Notes (Signed)
Subjective: Megan Mccoy is a 65 y.o. is seen today in office s/p left foot tarsal exostectomy preformed on 10/20/2020.  States that she is doing much better.  She still in the cam boot.  Denies any drainage or increased swelling on the surgical site.  No significant pain she not taking any pain medication. Denies any systemic complaints such as fevers, chills, nausea, vomiting. No calf pain, chest pain, shortness of breath.   Objective: General: No acute distress, AAOx3  DP/PT pulses palpable 2/4, CRT < 3 sec to all digits.  Protective sensation intact. Motor function intact.  LEFT foot: Incision is well coapted without any evidence of dehiscence there is a small scab on the incision.  There is minimal edema.  There is no swelling erythema, ascending cellulitis there is no drainage or pus or any signs of infection. Again tenderness palpation. No other areas of tenderness to bilateral lower extremities.  No other open lesions or pre-ulcerative lesions.  No pain with calf compression, swelling, warmth, erythema.   Assessment and Plan:  Status post left foot exostectomy, doing well with no complications   -Treatment options discussed including all alternatives, risks, and complications -At this point continue with antibiotic ointment on the incision daily and wait for the scab to come off on its own.  Continue cam boot for now.  She can slowly start to transition back into regular shoe discussed shoe to avoid pressure to the incision site.  Continue to ice and elevate.  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  Trula Slade DPM

## 2020-12-23 ENCOUNTER — Encounter: Payer: Commercial Managed Care - PPO | Admitting: Podiatry

## 2021-09-19 ENCOUNTER — Ambulatory Visit: Payer: Commercial Managed Care - PPO | Admitting: Medical

## 2021-09-19 ENCOUNTER — Other Ambulatory Visit: Payer: Self-pay

## 2021-09-19 ENCOUNTER — Encounter: Payer: Self-pay | Admitting: Medical

## 2021-09-19 ENCOUNTER — Other Ambulatory Visit (HOSPITAL_COMMUNITY)
Admission: RE | Admit: 2021-09-19 | Discharge: 2021-09-19 | Disposition: A | Discharge status: Home / Self Care | Payer: Commercial Managed Care - PPO | Source: Ambulatory Visit | Attending: Medical | Admitting: Medical

## 2021-09-19 ENCOUNTER — Encounter: Payer: Commercial Managed Care - PPO | Admitting: Medical

## 2021-09-19 VITALS — BP 130/80 | HR 60 | Ht 64.0 in | Wt 151.0 lb

## 2021-09-19 DIAGNOSIS — Z13220 Encounter for screening for lipoid disorders: Secondary | ICD-10-CM | POA: Diagnosis not present

## 2021-09-19 DIAGNOSIS — Z23 Encounter for immunization: Secondary | ICD-10-CM

## 2021-09-19 DIAGNOSIS — Z0000 Encounter for general adult medical examination without abnormal findings: Secondary | ICD-10-CM

## 2021-09-19 DIAGNOSIS — Z124 Encounter for screening for malignant neoplasm of cervix: Secondary | ICD-10-CM | POA: Diagnosis present

## 2021-09-19 DIAGNOSIS — Z1231 Encounter for screening mammogram for malignant neoplasm of breast: Secondary | ICD-10-CM

## 2021-09-19 DIAGNOSIS — M8580 Other specified disorders of bone density and structure, unspecified site: Secondary | ICD-10-CM

## 2021-09-19 DIAGNOSIS — E2839 Other primary ovarian failure: Secondary | ICD-10-CM

## 2021-09-19 DIAGNOSIS — Z780 Asymptomatic menopausal state: Secondary | ICD-10-CM

## 2021-09-19 DIAGNOSIS — N841 Polyp of cervix uteri: Secondary | ICD-10-CM

## 2021-09-19 DIAGNOSIS — E559 Vitamin D deficiency, unspecified: Secondary | ICD-10-CM

## 2021-09-19 NOTE — Progress Notes (Signed)
Need D script [facility] Left scalp within hairline 0.5 c diam bony lump Dr. Thornton [place], colon 2020   Subjective:   HPI  Megan Mccoy is a 65 y.o. female who presents for Chief Complaint  Patient presents with   fasitng cpe    Fasting cpe, wants flu shot and pfizer today. Would like pap done today    Medical care team includes: Mariaisabel Bodiford, Camelia Eng, PA-C here for primary care Dentist Eye doctor Dr. Thornton Park, GI Dr. Celesta Gentile, podiatry    Concerns: Been doing well in general.  She had foot surgery for bone spur this past year.  She has a knot on the top of her scalp within the hairline on the left she wants looked at.  She thinks it is new in the last several months but not growing.  Hx/o 2 pregnancies with live births, no current bleeding, hx/o fibroid surgery.     Reviewed their medical, surgical, family, social, medication, and allergy history and updated chart as appropriate.  Past Medical History:  Diagnosis Date   Allergy    Vitamin D deficiency 2019    Past Surgical History:  Procedure Laterality Date   BREAST EXCISIONAL BIOPSY Left    COLONOSCOPY     age 11yo   COLONOSCOPY  05/26/2019   mild melanosis coli, otherwise normal.   Dr. Thornton Park   FOOT SURGERY Left 10/2020   bone spur removal left foot   TUBAL LIGATION     UTERINE FIBROID SURGERY       Family History  Problem Relation Age of Onset   Diabetes Mother    Heart disease Mother 73       MI   Cancer Father        lung   Lung cancer Father    Asthma Sister    Colon polyps Sister    Cancer Brother        brain and lung, prior smoker   Cancer Brother        lung, smoker   Lung cancer Brother    Heart disease Brother        died in 88s of hole in the heart   Breast cancer Paternal Grandmother    Colon cancer Neg Hx    Esophageal cancer Neg Hx    Rectal cancer Neg Hx    Stomach cancer Neg Hx      Current Outpatient Medications:    cholecalciferol  (VITAMIN D3) 25 MCG (1000 UNIT) tablet, Take 2 tablets (2,000 Units total) by mouth daily., Disp: 180 tablet, Rfl: 3   Multiple Vitamins-Minerals (MULTIVITAMIN WITH MINERALS) tablet, Take 1 tablet by mouth daily. , Disp: , Rfl:   No Known Allergies   Review of Systems Constitutional: -fever, -chills, -sweats, -unexpected weight change, -decreased appetite, -fatigue Allergy: -sneezing, -itching, -congestion Dermatology: -changing moles, --rash, +lumps ENT: -runny nose, -ear pain, -sore throat, -hoarseness, -sinus pain, -teeth pain, - ringing in ears, -hearing loss, -nosebleeds Cardiology: -chest pain, -palpitations, -swelling, -difficulty breathing when lying flat, -waking up short of breath Respiratory: -cough, -shortness of breath, -difficulty breathing with exercise or exertion, -wheezing, -coughing up blood Gastroenterology: -abdominal pain, -nausea, -vomiting, -diarrhea, -constipation, -blood in stool, -changes in bowel movement, -difficulty swallowing or eating Hematology: -bleeding, -bruising  Musculoskeletal: -joint aches, -muscle aches, -joint swelling, -back pain, -neck pain, -cramping, -changes in gait Ophthalmology: denies vision changes, eye redness, itching, discharge Urology: -burning with urination, -difficulty urinating, -blood in urine, -urinary frequency, -urgency, -incontinence Neurology: -headache, -  weakness, -tingling, -numbness, -memory loss, -falls, -dizziness Psychology: -depressed mood, -agitation, -sleep problems Breast/gyn: -breast tenderness, -discharge, -lumps, -vaginal discharge,- irregular periods, -heavy periods     Objective:  BP 130/80   Pulse 60   Ht 5\' 4"  (1.626 m)   Wt 151 lb (68.5 kg)   BMI 25.92 kg/m   General appearance: alert, no distress, WD/WN,lean African American female Skin: Left scalp within the hairline in the parietal lesion with a small 8 mm round slightly raised bony lump/bone spur of the cranium, left lower medial leg with 5cm x  2.5cm flat birth mark, no worrisome lesions HEENT: normocephalic, conjunctiva/corneas normal, sclerae anicteric, PERRLA, EOMi Neck: supple, no lymphadenopathy, no thyromegaly, no masses, normal ROM, no bruits Chest: non tender, normal shape and expansion Heart: RRR, normal S1, S2, no murmurs Lungs: CTA bilaterally, no wheezes, rhonchi, or rales Abdomen: +bs, soft, non tender, non distended, no masses, no hepatomegaly, no splenomegaly, no bruits Back: non tender, normal ROM, no scoliosis Musculoskeletal: upper extremities non tender, no obvious deformity, normal ROM throughout, lower extremities non tender, no obvious deformity, normal ROM throughout Extremities: no edema, no cyanosis, no clubbing Pulses: 2+ symmetric, upper and lower extremities, normal cap refill Neurological: alert, oriented x 3, CN2-12 intact, strength normal upper extremities and lower extremities, sensation normal throughout, DTRs 2+ throughout, no cerebellar signs, gait normal Psychiatric: normal affect, behavior normal, pleasant   Breast: Left breast at 3 o'clock position just lateral to the nipple and areola with a approximately 1.5 cm x 1 cm nodule that patient notes is unchanged, left upper lateral breast at approximately 2 o'clock position with linear surgical scar from prior biopsy, otherwise, nontender, no masses or lumps, no skin changes, no nipple discharge or inversion, no axillary lymphadenopathy Gyn: Normal external genitalia without lesions, vagina with normal mucosa, cervix with reddish-purple polyp appearing lesion projecting out the os of the cervix otherwise, no cervical motion tenderness, no abnormal vaginal discharge.  Uterus and adnexa not enlarged, nontender, no masses.  Pap performed.  Exam chaperoned by nurse. Rectal: Anus normal appearing   Assessment and Plan :   Encounter Diagnoses  Name Primary?   Encounter for health maintenance examination in adult Yes   Needs flu shot    COVID-19 vaccine  administered    Encounter for screening mammogram for malignant neoplasm of breast    Screening for lipid disorders    Vitamin D deficiency    Screening for cervical cancer    Osteopenia, unspecified location    Post-menopausal    Estrogen deficiency    Cervical polyp       This visit was a preventative care visit, also known as wellness visit or routine physical.   Topics typically include healthy lifestyle, diet, exercise, preventative care, vaccinations, sick and well care, proper use of emergency dept and after hours care, as well as other concerns.     Recommendations: Continue to return yearly for your annual wellness and preventative care visits.  This gives [place] a chance to discuss healthy lifestyle, exercise, vaccinations, review your chart record, and perform screenings where appropriate.  I recommend you see your eye doctor yearly for routine vision care.  I recommend you see your dentist yearly for routine dental care including hygiene visits twice yearly.   Vaccination recommendations were reviewed Immunization History  Administered Date(s) Administered   Influenza, Seasonal, Injecte, Preservative Fre 08/03/2014   Influenza,inj,Quad PF,6+ Mos 07/15/2018, 07/21/2019, 09/19/2021   Influenza-Unspecified 08/01/2020   PFIZER(Purple Top)SARS-COV-2 Vaccination 01/10/2020, 01/31/2020, 08/15/2020,  03/14/2021   Pfizer Covid-19 Vaccine Bivalent Booster 60yrs & up 09/19/2021   Tdap 08/28/2018   Zoster Recombinat (Shingrix) 08/24/2018    Counseled on the influenza virus vaccine.  Vaccine information sheet given.  Influenza vaccine given after consent obtained.  Counseled on the Covid virus vaccine.  Vaccine information sheet given.  Covid vaccine given after consent obtained.    Screening for cancer: Colon cancer screening: I reviewed your colonoscopy on file that is up to date from 2020  Breast cancer screening: You should perform a self breast exam monthly.   We  reviewed recommendations for regular mammograms and breast cancer screening.   Cervical cancer screening: We reviewed recommendations for pap smear screening.   Skin cancer screening: Check your skin regularly for new changes, growing lesions, or other lesions of concern Come in for evaluation if you have skin lesions of concern.  Lung cancer screening: If you have a greater than 20 pack year history of tobacco use, then you may qualify for lung cancer screening with a chest CT scan.   Please call your insurance company to inquire about coverage for this test.  We currently don't have screenings for other cancers besides breast, cervical, colon, and lung cancers.  If you have a strong family history of cancer or have other cancer screening concerns, please let me know.    Bone health: Get at least 150 minutes of aerobic exercise weekly Get weight bearing exercise at least once weekly Bone density test:  A bone density test is an imaging test that uses a type of X-ray to measure the amount of calcium and other minerals in your bones. The test may be used to diagnose or screen you for a condition that causes weak or thin bones (osteoporosis), predict your risk for a broken bone (fracture), or determine how well your osteoporosis treatment is working. The bone density test is recommended for females 19 and older, or females or males <67 if certain risk factors such as thyroid disease, long term use of steroids such as for asthma or rheumatological issues, vitamin D deficiency, estrogen deficiency, family history of osteoporosis, self or family history of fragility fracture in first degree relative.  I reviewed bone density test from 2021.  She will be due January 2023.  Heart health: Get at least 150 minutes of aerobic exercise weekly Limit alcohol It is important to maintain a healthy blood pressure and healthy cholesterol numbers  Heart disease screening: Screening for heart disease  includes screening for blood pressure, fasting lipids, glucose/diabetes screening, BMI height to weight ratio, reviewed of smoking status, physical activity, and diet.    Goals include blood pressure 120/80 or less, maintaining a healthy lipid/cholesterol profile, preventing diabetes or keeping diabetes numbers under good control, not smoking or using tobacco products, exercising most days per week or at least 150 minutes per week of exercise, and eating healthy variety of fruits and vegetables, healthy oils, and avoiding unhealthy food choices like fried food, fast food, high sugar and high cholesterol foods.    Other tests may possibly include EKG test, CT coronary calcium score, echocardiogram, exercise treadmill stress test.    Medical care options: I recommend you continue to seek care here first for routine care.  We try really hard to have available appointments Monday through Friday daytime hours for sick visits, acute visits, and physicals.  Urgent care should be used for after hours and weekends for significant issues that cannot wait till the next day.  The  emergency department should be used for significant potentially life-threatening emergencies.  The emergency department is expensive, can often have long wait times for less significant concerns, so try to utilize primary care, urgent care, or telemedicine when possible to avoid unnecessary trips to the emergency department.  Virtual visits and telemedicine have been introduced since the pandemic started in 2020, and can be convenient ways to receive medical care.  We offer virtual appointments as well to assist you in a variety of options to seek medical care.   Advanced Directives: I recommend you consider completing a [place] and Living Will.   These documents respect your wishes and help alleviate burdens on your loved ones if you were to become terminally ill or be in a position to need those documents enforced.     You can complete Advanced Directives yourself, have them notarized, then have copies made for our office, for you and for anybody you feel should have them in safe keeping.  Or, you can have an attorney prepare these documents.   If you haven't updated your Last Will and Testament in a while, it may be worthwhile having an attorney prepare these documents together and save on some costs.         Separate significant chronic issues discussed: Vit D deficiency -continue supplement  Cervical polyp noted on exam today-referral to gynecology for this  C/t aerobic and weight bearing exercise  Osteopenia - counseled on need for weight bearing and aerobic exercise, c/t vit supplement, discussed calcium intake . Repeat bone density scan 2023.    Anzal was seen today for fasitng cpe.  Diagnoses and all orders for this visit:  Encounter for health maintenance examination in adult -     MM DIGITAL SCREENING BILATERAL; Future -     DG Bone Density; Future -     Comprehensive metabolic panel -     CBC -     Lipid panel -     VITAMIN D 25 Hydroxy (Vit-D Deficiency, Fractures) -     Urinalysis, Routine w reflex microscopic -     Cytology - PAP([place])  Needs flu shot -     Flu Vaccine QUAD 72mo+IM (Fluarix, Fluzone & Alfiuria Quad PF)  COVID-19 vaccine administered -     Pension scheme manager  Encounter for screening mammogram for malignant neoplasm of breast -     MM DIGITAL SCREENING BILATERAL; Future  Screening for lipid disorders -     Lipid panel  Vitamin D deficiency -     VITAMIN D 25 Hydroxy (Vit-D Deficiency, Fractures)  Screening for cervical cancer -     Cytology - PAP()  Osteopenia, unspecified location -     DG Bone Density; Future  Post-menopausal -     DG Bone Density; Future  Estrogen deficiency -     DG Bone Density; Future  Cervical polyp -     Ambulatory referral to Gynecology  Follow-up pending labs, yearly for  physical

## 2021-09-20 ENCOUNTER — Other Ambulatory Visit: Payer: Self-pay | Admitting: Medical

## 2021-09-20 LAB — COMPREHENSIVE METABOLIC PANEL
ALT: 15 IU/L (ref 0–32)
AST: 19 IU/L (ref 0–40)
Albumin/Globulin Ratio: 1.6 (ref 1.2–2.2)
Albumin: 4.9 g/dL — ABNORMAL HIGH (ref 3.8–4.8)
Alkaline Phosphatase: 67 IU/L (ref 44–121)
BUN/Creatinine Ratio: 15 (ref 12–28)
BUN: 13 mg/dL (ref 8–27)
Bilirubin Total: 0.4 mg/dL (ref 0.0–1.2)
CO2: 23 mmol/L (ref 20–29)
Calcium: 9.7 mg/dL (ref 8.7–10.3)
Chloride: 99 mmol/L (ref 96–106)
Creatinine, Ser: 0.87 mg/dL (ref 0.57–1.00)
Globulin, Total: 3 g/dL (ref 1.5–4.5)
Glucose: 73 mg/dL (ref 70–99)
Potassium: 4.1 mmol/L (ref 3.5–5.2)
Sodium: 139 mmol/L (ref 134–144)
Total Protein: 7.9 g/dL (ref 6.0–8.5)
eGFR: 75 mL/min/{1.73_m2} (ref >59)

## 2021-09-20 LAB — VITAMIN D 25 HYDROXY (VIT D DEFICIENCY, FRACTURES): Vit D, 25-Hydroxy: 32.1 ng/mL (ref 30.0–100.0)

## 2021-09-20 LAB — URINALYSIS, ROUTINE W REFLEX MICROSCOPIC
Bilirubin, UA: NEGATIVE
Glucose, UA: NEGATIVE
Ketones, UA: NEGATIVE
Leukocytes,UA: NEGATIVE
Nitrite, UA: NEGATIVE
Protein,UA: NEGATIVE
RBC, UA: NEGATIVE
Specific Gravity, UA: 1.008 (ref 1.005–1.030)
Urobilinogen, Ur: 0.2 mg/dL (ref 0.2–1.0)
pH, UA: 6 (ref 5.0–7.5)

## 2021-09-20 LAB — LIPID PANEL
Chol/HDL Ratio: 3.3 ratio (ref 0.0–4.4)
Cholesterol, Total: 190 mg/dL (ref 100–199)
HDL: 58 mg/dL (ref >39)
LDL Chol Calc (NIH): 119 mg/dL — ABNORMAL HIGH (ref 0–99)
Triglycerides: 69 mg/dL (ref 0–149)
VLDL Cholesterol Cal: 13 mg/dL (ref 5–40)

## 2021-09-20 LAB — CBC
Hematocrit: 44.2 % (ref 34.0–46.6)
Hemoglobin: 14.4 g/dL (ref 11.1–15.9)
MCH: 27.5 pg (ref 26.6–33.0)
MCHC: 32.6 g/dL (ref 31.5–35.7)
MCV: 85 fL (ref 79–97)
Platelets: 247 10*3/uL (ref 150–450)
RBC: 5.23 x10E6/uL (ref 3.77–5.28)
RDW: 13.1 % (ref 11.7–15.4)
WBC: 4.8 10*3/uL (ref 3.4–10.8)

## 2021-09-20 MED ORDER — VITAMIN D 50 MCG (2000 UT) PO CAPS: 1.0000 | ORAL_CAPSULE | Freq: Every day | ORAL | 3 refills | Status: DC

## 2021-09-22 ENCOUNTER — Other Ambulatory Visit: Payer: Self-pay | Admitting: Medical

## 2021-09-22 DIAGNOSIS — M8580 Other specified disorders of bone density and structure, unspecified site: Secondary | ICD-10-CM

## 2021-09-22 DIAGNOSIS — Z0000 Encounter for general adult medical examination without abnormal findings: Secondary | ICD-10-CM

## 2021-09-22 DIAGNOSIS — E2839 Other primary ovarian failure: Secondary | ICD-10-CM

## 2021-09-22 DIAGNOSIS — Z780 Asymptomatic menopausal state: Secondary | ICD-10-CM

## 2021-09-23 LAB — CYTOLOGY - PAP
Comment: NEGATIVE
Diagnosis: NEGATIVE
High risk HPV: NEGATIVE

## 2021-10-11 ENCOUNTER — Other Ambulatory Visit (INDEPENDENT_AMBULATORY_CARE_PROVIDER_SITE_OTHER): Payer: Commercial Managed Care - PPO

## 2021-10-11 DIAGNOSIS — Z23 Encounter for immunization: Secondary | ICD-10-CM

## 2021-10-12 ENCOUNTER — Other Ambulatory Visit: Payer: Commercial Managed Care - PPO

## 2021-10-26 ENCOUNTER — Ambulatory Visit
Admission: RE | Admit: 2021-10-26 | Discharge: 2021-10-26 | Disposition: A | Discharge status: Home / Self Care | Payer: Commercial Managed Care - PPO | Source: Ambulatory Visit | Attending: Medical | Admitting: Medical

## 2021-10-26 DIAGNOSIS — Z0000 Encounter for general adult medical examination without abnormal findings: Secondary | ICD-10-CM

## 2021-10-26 DIAGNOSIS — Z1231 Encounter for screening mammogram for malignant neoplasm of breast: Secondary | ICD-10-CM

## 2021-11-02 ENCOUNTER — Other Ambulatory Visit: Payer: Self-pay | Admitting: Obstetrics and Gynecology

## 2022-02-20 ENCOUNTER — Other Ambulatory Visit: Payer: Commercial Managed Care - PPO

## 2022-03-15 ENCOUNTER — Other Ambulatory Visit: Payer: Self-pay | Admitting: Podiatry

## 2022-03-15 ENCOUNTER — Other Ambulatory Visit: Payer: Self-pay | Admitting: Medical

## 2022-07-12 ENCOUNTER — Encounter: Payer: Self-pay | Admitting: Internal Medicine

## 2022-07-25 ENCOUNTER — Other Ambulatory Visit (INDEPENDENT_AMBULATORY_CARE_PROVIDER_SITE_OTHER): Payer: Commercial Managed Care - PPO

## 2022-07-25 DIAGNOSIS — Z23 Encounter for immunization: Secondary | ICD-10-CM

## 2022-09-20 ENCOUNTER — Ambulatory Visit: Payer: Commercial Managed Care - PPO | Admitting: Medical

## 2022-09-20 ENCOUNTER — Encounter: Payer: Self-pay | Admitting: Medical

## 2022-09-20 VITALS — BP 102/64 | HR 64 | Ht 64.0 in | Wt 157.4 lb

## 2022-09-20 DIAGNOSIS — Z780 Asymptomatic menopausal state: Secondary | ICD-10-CM

## 2022-09-20 DIAGNOSIS — Z1231 Encounter for screening mammogram for malignant neoplasm of breast: Secondary | ICD-10-CM

## 2022-09-20 DIAGNOSIS — Z131 Encounter for screening for diabetes mellitus: Secondary | ICD-10-CM

## 2022-09-20 DIAGNOSIS — Z136 Encounter for screening for cardiovascular disorders: Secondary | ICD-10-CM | POA: Diagnosis not present

## 2022-09-20 DIAGNOSIS — Z13220 Encounter for screening for lipoid disorders: Secondary | ICD-10-CM

## 2022-09-20 DIAGNOSIS — Z0000 Encounter for general adult medical examination without abnormal findings: Secondary | ICD-10-CM | POA: Diagnosis not present

## 2022-09-20 DIAGNOSIS — Z23 Encounter for immunization: Secondary | ICD-10-CM | POA: Diagnosis not present

## 2022-09-20 DIAGNOSIS — E559 Vitamin D deficiency, unspecified: Secondary | ICD-10-CM

## 2022-09-20 DIAGNOSIS — M8580 Other specified disorders of bone density and structure, unspecified site: Secondary | ICD-10-CM | POA: Diagnosis not present

## 2022-09-20 DIAGNOSIS — E2839 Other primary ovarian failure: Secondary | ICD-10-CM

## 2022-09-20 DIAGNOSIS — Z1211 Encounter for screening for malignant neoplasm of colon: Secondary | ICD-10-CM

## 2022-09-20 LAB — POCT URINALYSIS DIP (PROADVANTAGE DEVICE)
Bilirubin, UA: NEGATIVE
Blood, UA: NEGATIVE
Glucose, UA: NEGATIVE mg/dL
Ketones, POC UA: NEGATIVE mg/dL
Leukocytes, UA: NEGATIVE
Nitrite, UA: NEGATIVE
Protein Ur, POC: NEGATIVE mg/dL
Specific Gravity, Urine: 1.01
Urobilinogen, Ur: 0.2
pH, UA: 6 (ref 5.0–8.0)

## 2022-09-20 NOTE — Addendum Note (Signed)
Addended by: Minette Headland A on: 09/20/2022 03:26 PM   Modules accepted: Orders

## 2022-09-20 NOTE — Progress Notes (Signed)
Subjective:   HPI  Megan Mccoy is a 66 y.o. female who presents for Chief Complaint  Patient presents with   Annual Exam    Fasting annual exam with pelvic. Did have some indigestion yesterday, took some Tums and it did help. She was at the beach and thinks because she was not eating right.     Medical care team includes: Rida Loudin, Camelia Eng, PA-C here for primary care Dentist Eye doctor Dr. Thornton Park, GI Dr. Celesta Gentile, podiatry   Concerns: Indigestion from eating certain foods last week on vacation, otherwise doing well  Saw gyn per our referral last year, had polyp removed and other tests.   Reassured.   Reviewed their medical, surgical, family, social, medication, and allergy history and updated chart as appropriate.  Past Medical History:  Diagnosis Date   Allergy    Vitamin D deficiency 2019    Past Surgical History:  Procedure Laterality Date   BREAST EXCISIONAL BIOPSY Left    COLONOSCOPY     age 64yo   COLONOSCOPY  05/26/2019   mild melanosis coli, otherwise normal.   Dr. Thornton Park   FOOT SURGERY Left 10/2020   bone spur removal left foot   TUBAL LIGATION     UTERINE FIBROID SURGERY       Family History  Problem Relation Age of Onset   Diabetes Mother    Heart disease Mother 38       MI   Cancer Father        lung   Lung cancer Father    Asthma Sister    Colon polyps Sister    Cancer Brother        brain and lung, prior smoker   Cancer Brother        lung, smoker   Lung cancer Brother    Heart disease Brother        died in 42s of hole in the heart   Breast cancer Paternal Grandmother    Colon cancer Neg Hx    Esophageal cancer Neg Hx    Rectal cancer Neg Hx    Stomach cancer Neg Hx      Current Outpatient Medications:    Cholecalciferol (VITAMIN D) 50 MCG (2000 UT) CAPS, Take 1 capsule (2,000 Units total) by mouth daily., Disp: 90 capsule, Rfl: 3   Multiple Vitamins-Minerals (MULTIVITAMIN WITH MINERALS) tablet,  Take 1 tablet by mouth daily. , Disp: , Rfl:   No Known Allergies   Review of Systems Constitutional: -fever, -chills, -sweats, -unexpected weight change, -decreased appetite, -fatigue Allergy: -sneezing, -itching, -congestion Dermatology: -changing moles, --rash, -lumps ENT: -runny nose, -ear pain, -sore throat, -hoarseness, -sinus pain, -teeth pain, - ringing in ears, -hearing loss, -nosebleeds Cardiology: -chest pain, -palpitations, -swelling, -difficulty breathing when lying flat, -waking up short of breath Respiratory: -cough, -shortness of breath, -difficulty breathing with exercise or exertion, -wheezing, -coughing up blood Gastroenterology: -abdominal pain, -nausea, -vomiting, -diarrhea, -constipation, -blood in stool, -changes in bowel movement, -difficulty swallowing or eating Hematology: -bleeding, -bruising  Musculoskeletal: -joint aches, -muscle aches, -joint swelling, -back pain, -neck pain, -cramping, -changes in gait Ophthalmology: denies vision changes, eye redness, itching, discharge Urology: -burning with urination, -difficulty urinating, -blood in urine, -urinary frequency, -urgency, -incontinence Neurology: -headache, -weakness, -tingling, -numbness, -memory loss, -falls, -dizziness Psychology: -depressed mood, -agitation, -sleep problems Breast/gyn: -breast tenderness, -discharge, -lumps, -vaginal discharge,- irregular periods, -heavy periods     Objective:  BP 102/64   Pulse 64   Ht '5\' 4"'$  (  1.626 m)   Wt 157 lb 6.4 oz (71.4 kg)   BMI 27.02 kg/m   Wt Readings from Last 3 Encounters:  09/20/22 157 lb 6.4 oz (71.4 kg)  09/19/21 151 lb (68.5 kg)  09/16/20 156 lb 3.2 oz (70.9 kg)   General appearance: alert, no distress, WD/WN,lean African American female Skin:  no worrisome lesions HEENT: normocephalic, conjunctiva/corneas normal, sclerae anicteric, PERRLA, EOMi Neck: supple, no lymphadenopathy, no thyromegaly, no masses, normal ROM, no bruits Chest: non  tender, normal shape and expansion Heart: RRR, normal S1, S2, no murmurs Lungs: CTA bilaterally, no wheezes, rhonchi, or rales Abdomen: +bs, soft, non tender, non distended, no masses, no hepatomegaly, no splenomegaly, no bruits Back: non tender, normal ROM, no scoliosis Musculoskeletal: upper extremities non tender, no obvious deformity, normal ROM throughout, lower extremities non tender, no obvious deformity, normal ROM throughout Extremities: no edema, no cyanosis, no clubbing Pulses: 2+ symmetric, upper and lower extremities, normal cap refill Neurological: alert, oriented x 3, CN2-12 intact, strength normal upper extremities and lower extremities, sensation normal throughout, DTRs 2+ throughout, no cerebellar signs, gait normal Psychiatric: normal affect, behavior normal, pleasant   Breast/gyn - deferred , declined  EKG reviewed   Assessment and Plan :   Encounter Diagnoses  Name Primary?   Annual physical exam Yes   Need for COVID-19 vaccine    Encounter for health maintenance examination in adult    Encounter for screening mammogram for malignant neoplasm of breast    Osteopenia, unspecified location    Vitamin D deficiency    Post-menopausal    Estrogen deficiency    Screening for lipid disorders    Screening for heart disease    Screening for diabetes mellitus     This visit was a preventative care visit, also known as wellness visit or routine physical.   Topics typically include healthy lifestyle, diet, exercise, preventative care, vaccinations, sick and well care, proper use of emergency dept and after hours care, as well as other concerns.     Recommendations: Continue to return yearly for your annual wellness and preventative care visits.  This gives [place] a chance to discuss healthy lifestyle, exercise, vaccinations, review your chart record, and perform screenings where appropriate.  I recommend you see your eye doctor yearly for routine vision care.  I recommend  you see your dentist yearly for routine dental care including hygiene visits twice yearly.   Vaccination recommendations were reviewed Immunization History  Administered Date(s) Administered   COVID-19, mRNA, vaccine(Comirnaty)12 years and older 09/20/2022   Influenza, Seasonal, Injecte, Preservative Fre 08/03/2014   Influenza,inj,Quad PF,6+ Mos 07/15/2018, 07/21/2019, 09/19/2021, 07/25/2022   Influenza-Unspecified 08/01/2020   PFIZER(Purple Top)SARS-COV-2 Vaccination 01/10/2020, 01/31/2020, 08/15/2020, 03/14/2021   Pfizer Covid-19 Vaccine Bivalent Booster 32yr & up 09/19/2021   Tdap 08/28/2018   Zoster Recombinat (Shingrix) 08/24/2018, 10/11/2021   Counseled on the Covid virus vaccine.  Vaccine information sheet given.  Covid vaccine given after consent obtained.  Consider RSV vaccine   Screening for cancer: Colon cancer screening: I reviewed your colonoscopy on file that is up to date from 2020 Will send home with fecal tube test today  Breast cancer screening: You should perform a self breast exam monthly.   We reviewed recommendations for regular mammograms and breast cancer screening.   Cervical cancer screening: We reviewed recommendations for pap smear screening.   Skin cancer screening: Check your skin regularly for new changes, growing lesions, or other lesions of concern Come in for evaluation if you  have skin lesions of concern.  Lung cancer screening: If you have a greater than 20 pack year history of tobacco use, then you may qualify for lung cancer screening with a chest CT scan.   Please call your insurance company to inquire about coverage for this test.  We currently don't have screenings for other cancers besides breast, cervical, colon, and lung cancers.  If you have a strong family history of cancer or have other cancer screening concerns, please let me know.    Bone health: Get at least 150 minutes of aerobic exercise weekly Get weight bearing  exercise at least once weekly Bone density test:  A bone density test is an imaging test that uses a type of X-ray to measure the amount of calcium and other minerals in your bones. The test may be used to diagnose or screen you for a condition that causes weak or thin bones (osteoporosis), predict your risk for a broken bone (fracture), or determine how well your osteoporosis treatment is working. The bone density test is recommended for females 84 and older, or females or males <25 if certain risk factors such as thyroid disease, long term use of steroids such as for asthma or rheumatological issues, vitamin D deficiency, estrogen deficiency, family history of osteoporosis, self or family history of fragility fracture in first degree relative.  I reviewed bone density test from 2021.  Due now for repeat.  Please call to schedule your bone density test.   [facility]  [phone] [address], [place], [place] [address]    Heart health: Get at least 150 minutes of aerobic exercise weekly Limit alcohol It is important to maintain a healthy blood pressure and healthy cholesterol numbers  Heart disease screening: Screening for heart disease includes screening for blood pressure, fasting lipids, glucose/diabetes screening, BMI height to weight ratio, reviewed of smoking status, physical activity, and diet.    Goals include blood pressure 120/80 or less, maintaining a healthy lipid/cholesterol profile, preventing diabetes or keeping diabetes numbers under good control, not smoking or using tobacco products, exercising most days per week or at least 150 minutes per week of exercise, and eating healthy variety of fruits and vegetables, healthy oils, and avoiding unhealthy food choices like fried food, fast food, high sugar and high cholesterol foods.    We will refer you for CT coronary test. Expect at phone call.   Medical care options: I recommend  you continue to seek care here first for routine care.  We try really hard to have available appointments Monday through Friday daytime hours for sick visits, acute visits, and physicals.  Urgent care should be used for after hours and weekends for significant issues that cannot wait till the next day.  The emergency department should be used for significant potentially life-threatening emergencies.  The emergency department is expensive, can often have long wait times for less significant concerns, so try to utilize primary care, urgent care, or telemedicine when possible to avoid unnecessary trips to the emergency department.  Virtual visits and telemedicine have been introduced since the pandemic started in 2020, and can be convenient ways to receive medical care.  We offer virtual appointments as well to assist you in a variety of options to seek medical care.   Advanced Directives: I recommend you consider completing a [place] and Living Will.   These documents respect your wishes and help alleviate burdens on your loved ones if  you were to become terminally ill or be in a position to need those documents enforced.    You can complete Advanced Directives yourself, have them notarized, then have copies made for our office, for you and for anybody you feel should have them in safe keeping.  Or, you can have an attorney prepare these documents.   If you haven't updated your Last Will and Testament in a while, it may be worthwhile having an attorney prepare these documents together and save on some costs.      Separate significant chronic issues discussed: Vit D deficiency -continue supplement, labs today  Osteopenia - counseled on need for weight bearing and aerobic exercise, c/t vit supplement, discussed calcium intake . Repeat bone density scan.   Ercilia was seen today for annual exam.  Diagnoses and all orders for this visit:  Annual physical exam -     POCT Urinalysis  DIP (Proadvantage Device) -     Visual acuity screening  Need for COVID-19 vaccine [employer] Fall 2023 Covid-19 Vaccine 44yr and older  Encounter for health maintenance examination in adult -     DG Bone Density; Future -     Comprehensive metabolic panel -     CBC -     Lipid panel -     VITAMIN D 25 Hydroxy (Vit-D Deficiency, Fractures) -     EKG 12-Lead -     Hemoglobin A1c -     CT CARDIAC SCORING ([facility] LOCATIONS ONLY); Future  Encounter for screening mammogram for malignant neoplasm of breast  Osteopenia, unspecified location -     DG Bone Density; Future  Vitamin D deficiency -     VITAMIN D 25 Hydroxy (Vit-D Deficiency, Fractures)  Post-menopausal -     DG Bone Density; Future  Estrogen deficiency -     DG Bone Density; Future  Screening for lipid disorders  Screening for heart disease -     EKG 12-Lead -     CT CARDIAC SCORING ([facility] LOCATIONS ONLY); Future  Screening for diabetes mellitus -     Hemoglobin A1c   Follow-up pending labs, yearly for physical

## 2022-09-21 ENCOUNTER — Other Ambulatory Visit: Payer: Self-pay | Admitting: Medical

## 2022-09-21 DIAGNOSIS — E2839 Other primary ovarian failure: Secondary | ICD-10-CM

## 2022-09-21 DIAGNOSIS — M8580 Other specified disorders of bone density and structure, unspecified site: Secondary | ICD-10-CM

## 2022-09-21 DIAGNOSIS — Z780 Asymptomatic menopausal state: Secondary | ICD-10-CM

## 2022-09-21 DIAGNOSIS — Z0000 Encounter for general adult medical examination without abnormal findings: Secondary | ICD-10-CM

## 2022-09-21 LAB — COMPREHENSIVE METABOLIC PANEL
ALT: 32 IU/L (ref 0–32)
AST: 39 IU/L (ref 0–40)
Albumin/Globulin Ratio: 1.5 (ref 1.2–2.2)
Albumin: 4.8 g/dL (ref 3.9–4.9)
Alkaline Phosphatase: 64 IU/L (ref 44–121)
BUN/Creatinine Ratio: 12 (ref 12–28)
BUN: 11 mg/dL (ref 8–27)
Bilirubin Total: 0.3 mg/dL (ref 0.0–1.2)
CO2: 25 mmol/L (ref 20–29)
Calcium: 9.9 mg/dL (ref 8.7–10.3)
Chloride: 100 mmol/L (ref 96–106)
Creatinine, Ser: 0.93 mg/dL (ref 0.57–1.00)
Globulin, Total: 3.3 g/dL (ref 1.5–4.5)
Glucose: 79 mg/dL (ref 70–99)
Potassium: 4.9 mmol/L (ref 3.5–5.2)
Sodium: 139 mmol/L (ref 134–144)
Total Protein: 8.1 g/dL (ref 6.0–8.5)
eGFR: 69 mL/min/{1.73_m2} (ref >59)

## 2022-09-21 LAB — LIPID PANEL
Chol/HDL Ratio: 2.9 ratio (ref 0.0–4.4)
Cholesterol, Total: 197 mg/dL (ref 100–199)
HDL: 68 mg/dL (ref >39)
LDL Chol Calc (NIH): 118 mg/dL — ABNORMAL HIGH (ref 0–99)
Triglycerides: 61 mg/dL (ref 0–149)
VLDL Cholesterol Cal: 11 mg/dL (ref 5–40)

## 2022-09-21 LAB — CBC
Hematocrit: 43.5 % (ref 34.0–46.6)
Hemoglobin: 13.9 g/dL (ref 11.1–15.9)
MCH: 27.3 pg (ref 26.6–33.0)
MCHC: 32 g/dL (ref 31.5–35.7)
MCV: 86 fL (ref 79–97)
Platelets: 237 10*3/uL (ref 150–450)
RBC: 5.09 x10E6/uL (ref 3.77–5.28)
RDW: 13.1 % (ref 11.7–15.4)
WBC: 4.7 10*3/uL (ref 3.4–10.8)

## 2022-09-21 LAB — HEMOGLOBIN A1C
Est. average glucose Bld gHb Est-mCnc: 123 mg/dL
Hgb A1c MFr Bld: 5.9 % — ABNORMAL HIGH (ref 4.8–5.6)

## 2022-09-21 LAB — VITAMIN D 25 HYDROXY (VIT D DEFICIENCY, FRACTURES): Vit D, 25-Hydroxy: 36.8 ng/mL (ref 30.0–100.0)

## 2022-10-25 ENCOUNTER — Other Ambulatory Visit: Payer: Commercial Managed Care - PPO

## 2022-11-28 ENCOUNTER — Ambulatory Visit
Admission: RE | Admit: 2022-11-28 | Discharge: 2022-11-28 | Disposition: A | Discharge status: Home / Self Care | Payer: Commercial Managed Care - PPO | Source: Ambulatory Visit | Attending: Medical | Admitting: Medical

## 2022-11-28 DIAGNOSIS — Z0000 Encounter for general adult medical examination without abnormal findings: Secondary | ICD-10-CM

## 2022-11-28 DIAGNOSIS — Z136 Encounter for screening for cardiovascular disorders: Secondary | ICD-10-CM

## 2022-11-29 NOTE — Progress Notes (Signed)
Results sent through MyChart

## 2023-03-12 ENCOUNTER — Ambulatory Visit
Admission: RE | Admit: 2023-03-12 | Discharge: 2023-03-12 | Disposition: A | Discharge status: Home / Self Care | Payer: Commercial Managed Care - PPO | Source: Ambulatory Visit | Attending: Medical | Admitting: Medical

## 2023-03-12 DIAGNOSIS — Z0000 Encounter for general adult medical examination without abnormal findings: Secondary | ICD-10-CM

## 2023-03-12 DIAGNOSIS — M8580 Other specified disorders of bone density and structure, unspecified site: Secondary | ICD-10-CM

## 2023-03-12 DIAGNOSIS — Z780 Asymptomatic menopausal state: Secondary | ICD-10-CM

## 2023-03-12 DIAGNOSIS — E2839 Other primary ovarian failure: Secondary | ICD-10-CM

## 2023-03-13 NOTE — Progress Notes (Signed)
Results sent through MyChart

## 2023-06-22 IMAGING — MG MM DIGITAL SCREENING BILAT W/ TOMO AND CAD
6 of 10 series · 6 of 30 positions shown · non-contrast
Comparison: Previous exam(s).

CLINICAL DATA: Screening.

EXAM:
DIGITAL SCREENING BILATERAL MAMMOGRAM WITH TOMOSYNTHESIS AND CAD
TECHNIQUE: Bilateral screening digital craniocaudal and mediolateral oblique
mammograms were obtained. Bilateral screening digital breast
tomosynthesis was performed. The images were evaluated with
computer-aided detection.

[L MLO synth-2D]
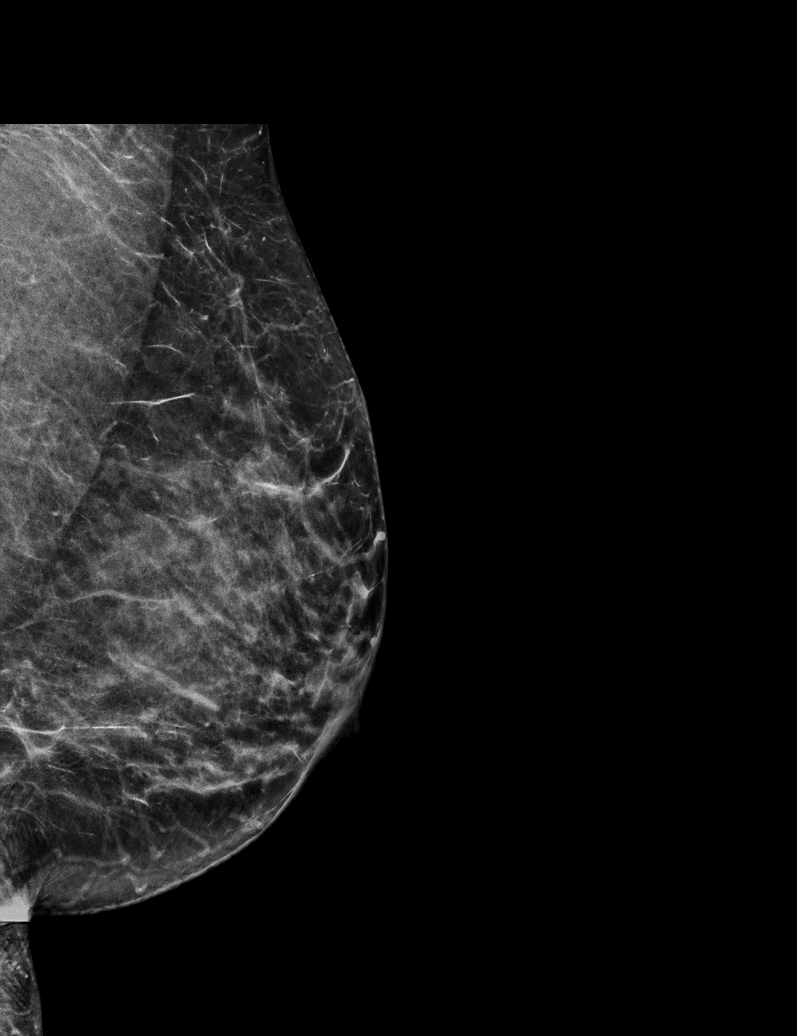

[L CC synth-2D]
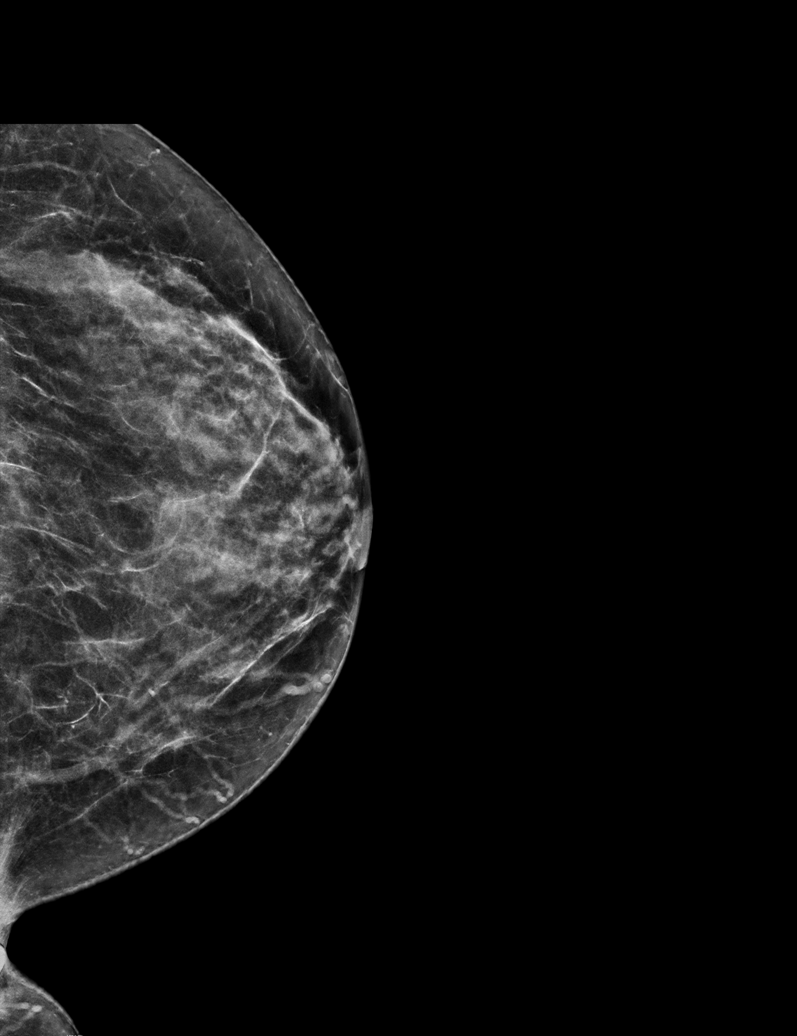

[R MLO synth-2D (1 of 2)]
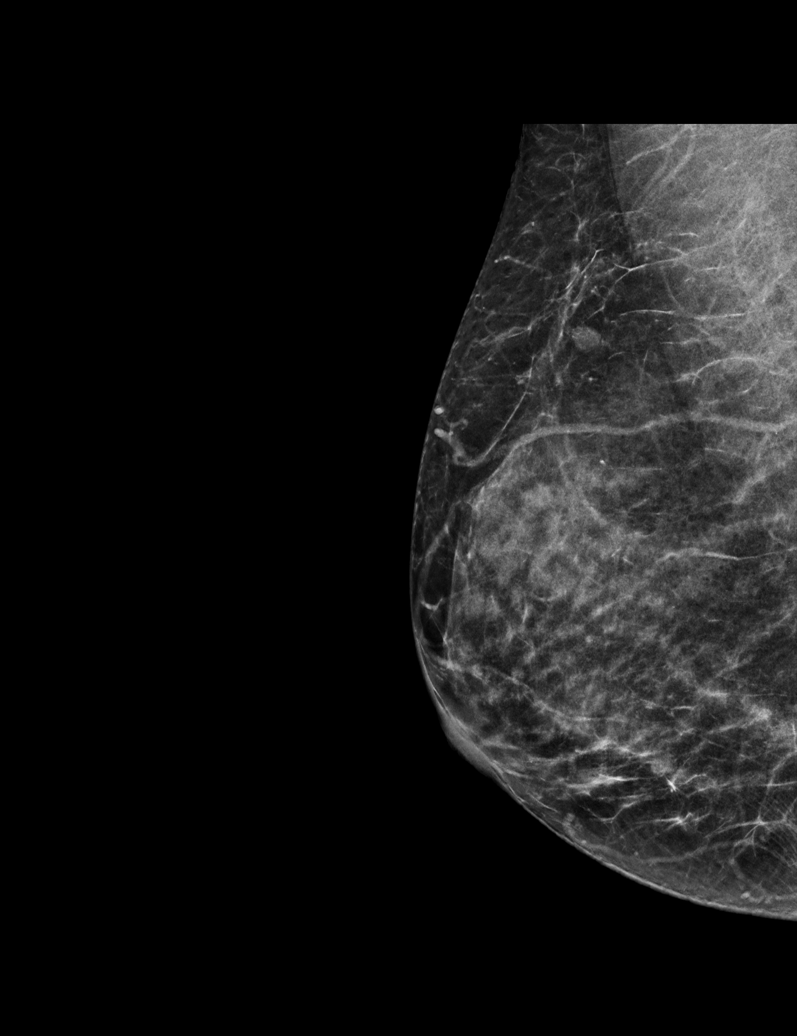

[R CC synth-2D]
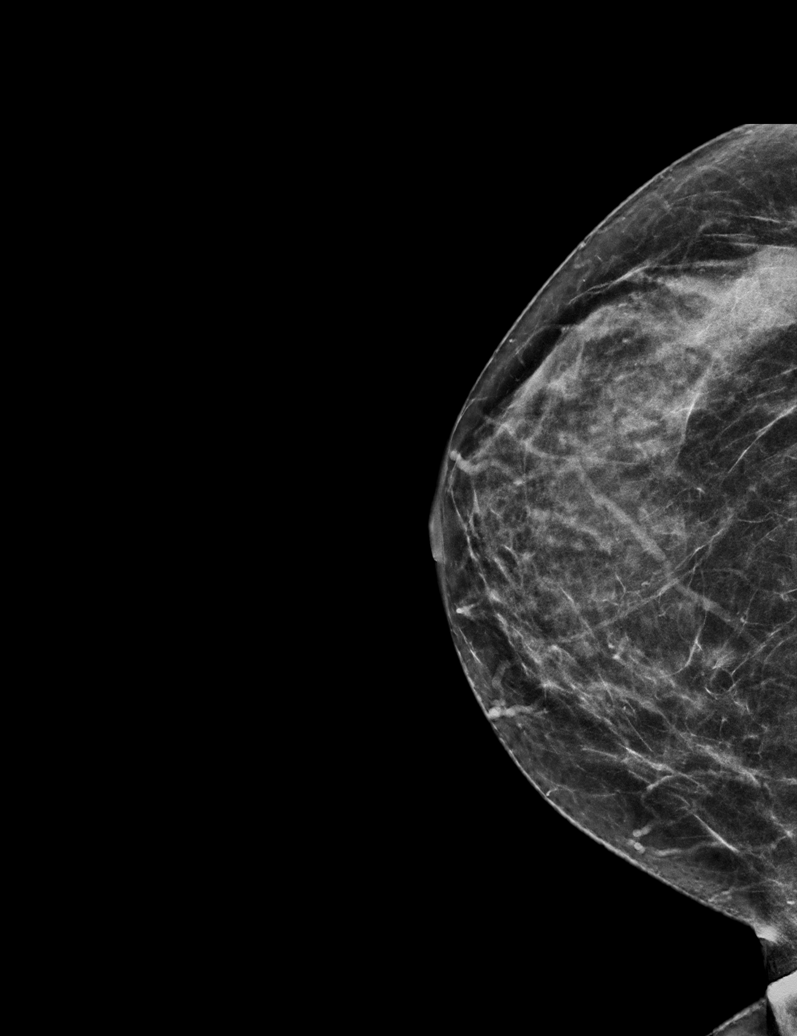

[R MLO synth-2D (2 of 2)]
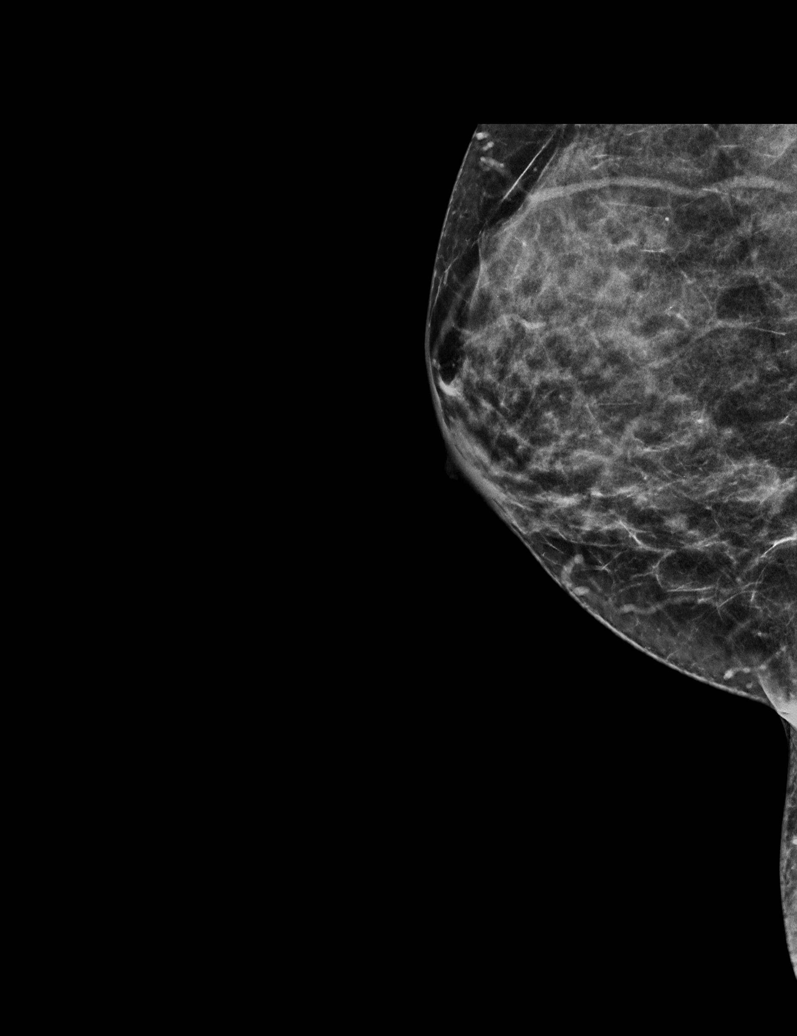

[R CC tomo · tomo slice 29/57.0]
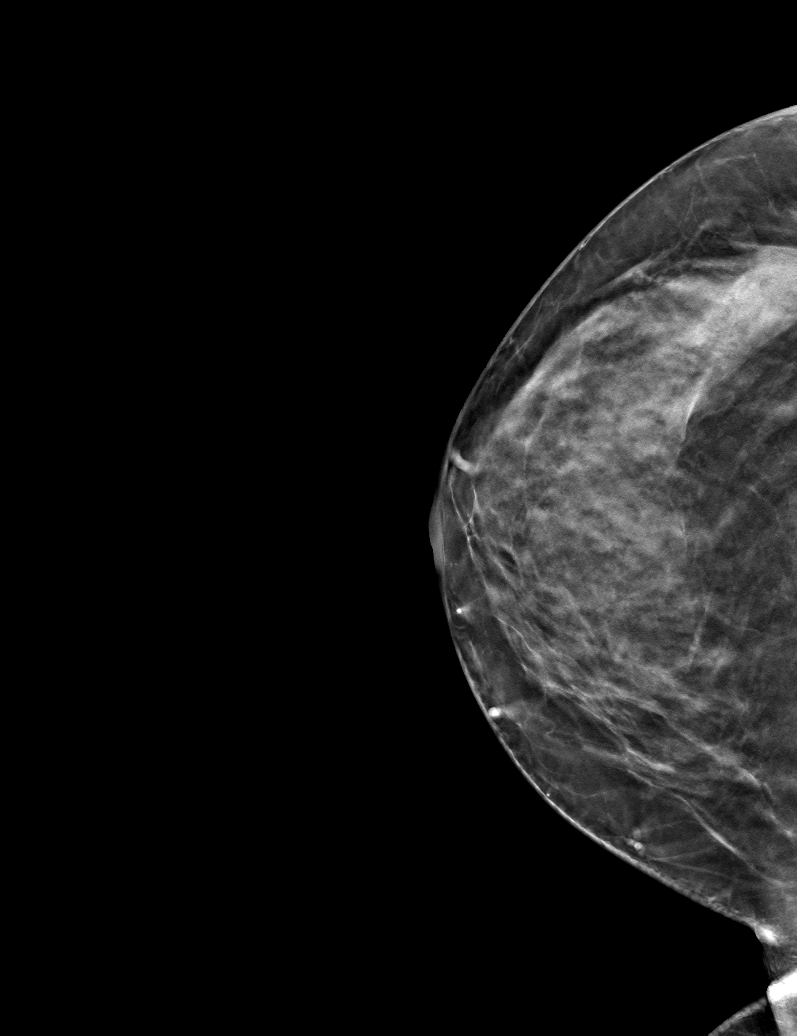

[6 of 30 positions shown; findings below may reference images not displayed]

ACR Breast Density Category d: The breast tissue is extremely dense,
which lowers the sensitivity of mammography
FINDINGS: There are no findings suspicious for malignancy.
IMPRESSION: No mammographic evidence of malignancy. A result letter of this
screening mammogram will be mailed directly to the patient.

RECOMMENDATION:
Screening mammogram in one year. (Code:[id number])

BI-RADS CATEGORY  1: Negative.

## 2023-09-25 ENCOUNTER — Encounter: Payer: Self-pay | Admitting: Medical

## 2023-09-25 ENCOUNTER — Other Ambulatory Visit (HOSPITAL_COMMUNITY)
Admission: RE | Admit: 2023-09-25 | Discharge: 2023-09-25 | Disposition: A | Discharge status: Home / Self Care | Payer: Commercial Managed Care - PPO | Source: Ambulatory Visit | Attending: Medical | Admitting: Medical

## 2023-09-25 ENCOUNTER — Ambulatory Visit: Payer: Commercial Managed Care - PPO | Admitting: Medical

## 2023-09-25 VITALS — BP 122/68 | HR 62 | Ht 64.0 in | Wt 157.8 lb

## 2023-09-25 DIAGNOSIS — M8580 Other specified disorders of bone density and structure, unspecified site: Secondary | ICD-10-CM

## 2023-09-25 DIAGNOSIS — Z0000 Encounter for general adult medical examination without abnormal findings: Secondary | ICD-10-CM

## 2023-09-25 DIAGNOSIS — Z1211 Encounter for screening for malignant neoplasm of colon: Secondary | ICD-10-CM

## 2023-09-25 DIAGNOSIS — Z7185 Encounter for immunization safety counseling: Secondary | ICD-10-CM

## 2023-09-25 DIAGNOSIS — Z124 Encounter for screening for malignant neoplasm of cervix: Secondary | ICD-10-CM | POA: Insufficient documentation

## 2023-09-25 DIAGNOSIS — R7301 Impaired fasting glucose: Secondary | ICD-10-CM

## 2023-09-25 DIAGNOSIS — Z1231 Encounter for screening mammogram for malignant neoplasm of breast: Secondary | ICD-10-CM | POA: Diagnosis not present

## 2023-09-25 NOTE — Patient Instructions (Signed)
This visit was a preventative care visit, also known as wellness visit or routine physical.   Topics typically include healthy lifestyle, diet, exercise, preventative care, vaccinations, sick and well care, proper use of emergency dept and after hours care, as well as other concerns.     Recommendations: Continue to return yearly for your annual wellness and preventative care visits.  This gives [place] a chance to discuss healthy lifestyle, exercise, vaccinations, review your chart record, and perform screenings where appropriate.  I recommend you see your eye doctor yearly for routine vision care.  I recommend you see your dentist yearly for routine dental care including hygiene visits twice yearly.   Vaccination recommendations were reviewed Immunization History  Administered Date(s) Administered   Influenza, Seasonal, Injecte, Preservative Fre 08/03/2014   Influenza,inj,Quad PF,6+ Mos 07/15/2018, 07/21/2019, 09/19/2021, 07/25/2022   Influenza-Unspecified 08/01/2020, 08/06/2023   PFIZER(Purple Top)SARS-COV-2 Vaccination 01/10/2020, 01/31/2020, 08/15/2020, 03/14/2021   Pfizer Covid-19 Vaccine Bivalent Booster 69yrs & up 09/19/2021   Pfizer(Comirnaty)Fall Seasonal Vaccine 12 years and older 09/20/2022, 08/06/2023   Tdap 08/28/2018   Zoster Recombinant(Shingrix) 08/24/2018, 10/11/2021   Counseled on the pneumococcal vaccine.  Vaccine information sheet given.  Pneumococcal vaccine Prevnar 20 given after consent obtained.   Screening for cancer: Colon cancer screening: I reviewed your colonoscopy on file that is up to date from 2020 Will send home with fecal tube test today  Breast cancer screening: You should perform a self breast exam monthly.   We reviewed recommendations for regular mammograms and breast cancer screening.   Cervical cancer screening: We reviewed recommendations for pap smear screening.   Skin cancer screening: Check your skin regularly for new changes, growing  lesions, or other lesions of concern Come in for evaluation if you have skin lesions of concern.  Lung cancer screening: If you have a greater than 20 pack year history of tobacco use, then you may qualify for lung cancer screening with a chest CT scan.   Please call your insurance company to inquire about coverage for this test.  We currently don't have screenings for other cancers besides breast, cervical, colon, and lung cancers.  If you have a strong family history of cancer or have other cancer screening concerns, please let me know.    Bone health: Get at least 150 minutes of aerobic exercise weekly Get weight bearing exercise at least once weekly Bone density test:  A bone density test is an imaging test that uses a type of X-ray to measure the amount of calcium and other minerals in your bones. The test may be used to diagnose or screen you for a condition that causes weak or thin bones (osteoporosis), predict your risk for a broken bone (fracture), or determine how well your osteoporosis treatment is working. The bone density test is recommended for females 65 and older, or females or males <65 if certain risk factors such as thyroid disease, long term use of steroids such as for asthma or rheumatological issues, vitamin D deficiency, estrogen deficiency, family history of osteoporosis, self or family history of fragility fracture in first degree relative.  I reviewed bone density test from 03/2023  Increase weight bearing exercise.    Heart health: Get at least 150 minutes of aerobic exercise weekly Limit alcohol It is important to maintain a healthy blood pressure and healthy cholesterol numbers  Heart disease screening: Screening for heart disease includes screening for blood pressure, fasting lipids, glucose/diabetes screening, BMI height to weight ratio, reviewed of smoking status, physical activity,  and diet.    Goals include blood pressure 120/80 or less, maintaining a  healthy lipid/cholesterol profile, preventing diabetes or keeping diabetes numbers under good control, not smoking or using tobacco products, exercising most days per week or at least 150 minutes per week of exercise, and eating healthy variety of fruits and vegetables, healthy oils, and avoiding unhealthy food choices like fried food, fast food, high sugar and high cholesterol foods.    I reviewed your CT coronary test from 03/2023 with score of zero.   Medical care options: I recommend you continue to seek care here first for routine care.  We try really hard to have available appointments Monday through Friday daytime hours for sick visits, acute visits, and physicals.  Urgent care should be used for after hours and weekends for significant issues that cannot wait till the next day.  The emergency department should be used for significant potentially life-threatening emergencies.  The emergency department is expensive, can often have long wait times for less significant concerns, so try to utilize primary care, urgent care, or telemedicine when possible to avoid unnecessary trips to the emergency department.  Virtual visits and telemedicine have been introduced since the pandemic started in 2020, and can be convenient ways to receive medical care.  We offer virtual appointments as well to assist you in a variety of options to seek medical care.   Advanced Directives: I recommend you consider completing a Health Care Power of Attorney and Living Will.   These documents respect your wishes and help alleviate burdens on your loved ones if you were to become terminally ill or be in a position to need those documents enforced.    You can complete Advanced Directives yourself, have them notarized, then have copies made for our office, for you and for anybody you feel should have them in safe keeping.  Or, you can have an attorney prepare these documents.   If you haven't updated your Last Will and  Testament in a while, it may be worthwhile having an attorney prepare these documents together and save on some costs.      Separate significant chronic issues discussed: Impaired fasting glucose - updated labs today  Vit D deficiency -continue supplement, labs today  Osteopenia - counseled on need for weight bearing and aerobic exercise, c/t vit supplement, discussed calcium intake . Repeat bone density scan.

## 2023-09-25 NOTE — Progress Notes (Signed)
Subjective:   HPI  Megan Mccoy is a 67 y.o. female who presents for Chief Complaint  Patient presents with   Annual Exam    Fasting cpe, no concerns would like breast and pap done today    Medical care team includes: Aina Rossbach, Cleda Mccreedy here for primary care Dentist Eye doctor Dr. Tressia Danas, GI Dr. Ovid Curd, podiatry   Concerns: none   Reviewed their medical, surgical, family, social, medication, and allergy history and updated chart as appropriate.  Past Medical History:  Diagnosis Date   Allergy    Vitamin D deficiency 2019    Past Surgical History:  Procedure Laterality Date   BREAST EXCISIONAL BIOPSY Left    COLONOSCOPY     age 20yo   COLONOSCOPY  05/26/2019   mild melanosis coli, otherwise normal.   Dr. Tressia Danas   FOOT SURGERY Left 10/2020   bone spur removal left foot   TUBAL LIGATION     UTERINE FIBROID SURGERY       Family History  Problem Relation Age of Onset   Diabetes Mother    Heart disease Mother 46       MI   Cancer Father        lung   Lung cancer Father    Asthma Sister    Colon polyps Sister    Dementia Sister    Cancer Brother        brain and lung, prior smoker   Cancer Brother        lung, smoker   Lung cancer Brother    Heart disease Brother        died in 50s of hole in the heart   Breast cancer Paternal Grandmother    Colon cancer Neg Hx    Esophageal cancer Neg Hx    Rectal cancer Neg Hx    Stomach cancer Neg Hx      Current Outpatient Medications:    Cholecalciferol (VITAMIN D) 50 MCG (2000 UT) CAPS, Take 1 capsule (2,000 Units total) by mouth daily., Disp: 90 capsule, Rfl: 3   Multiple Vitamins-Minerals (MULTIVITAMIN WITH MINERALS) tablet, Take 1 tablet by mouth daily. , Disp: , Rfl:   No Known Allergies   Review of Systems Constitutional: -fever, -chills, -sweats, -unexpected weight change, -decreased appetite, -fatigue Allergy: -sneezing, -itching, -congestion Dermatology:  -changing moles, --rash, -lumps ENT: -runny nose, -ear pain, -sore throat, -hoarseness, -sinus pain, -teeth pain, - ringing in ears, -hearing loss, -nosebleeds Cardiology: -chest pain, -palpitations, -swelling, -difficulty breathing when lying flat, -waking up short of breath Respiratory: -cough, -shortness of breath, -difficulty breathing with exercise or exertion, -wheezing, -coughing up blood Gastroenterology: -abdominal pain, -nausea, -vomiting, -diarrhea, -constipation, -blood in stool, -changes in bowel movement, -difficulty swallowing or eating Hematology: -bleeding, -bruising  Musculoskeletal: -joint aches, -muscle aches, -joint swelling, -back pain, -neck pain, -cramping, -changes in gait Ophthalmology: denies vision changes, eye redness, itching, discharge Urology: -burning with urination, -difficulty urinating, -blood in urine, -urinary frequency, -urgency, -incontinence Neurology: -headache, -weakness, -tingling, -numbness, -memory loss, -falls, -dizziness Psychology: -depressed mood, -agitation, -sleep problems Breast/gyn: -breast tenderness, -discharge, -lumps, -vaginal discharge,- irregular periods, -heavy periods       Objective:  BP 122/68   Pulse 62   Ht 5\' 4"  (1.626 m)   Wt 157 lb 12.8 oz (71.6 kg)   BMI 27.09 kg/m   Wt Readings from Last 3 Encounters:  09/25/23 157 lb 12.8 oz (71.6 kg)  09/20/22 157 lb 6.4 oz (71.4 kg)  09/19/21 151 lb (68.5 kg)   General appearance: alert, no distress, WD/WN,lean African American female Skin:  no worrisome lesions HEENT: normocephalic, conjunctiva/corneas normal, sclerae anicteric, PERRLA, EOMi Neck: supple, no lymphadenopathy, no thyromegaly, no masses, normal ROM, no bruits Chest: non tender, normal shape and expansion Heart: RRR, normal S1, S2, no murmurs Lungs: CTA bilaterally, no wheezes, rhonchi, or rales Abdomen: +bs, soft, non tender, non distended, no masses, no hepatomegaly, no splenomegaly, no bruits Back: non  tender, normal ROM, no scoliosis Musculoskeletal: upper extremities non tender, no obvious deformity, normal ROM throughout, lower extremities non tender, no obvious deformity, normal ROM throughout Extremities: no edema, no cyanosis, no clubbing Pulses: 2+ symmetric, upper and lower extremities, normal cap refill Neurological: alert, oriented x 3, CN2-12 intact, strength normal upper extremities and lower extremities, sensation normal throughout, DTRs 2+ throughout, no cerebellar signs, gait normal Psychiatric: normal affect, behavior normal, pleasant   Breast: nontender, no masses or lumps, no skin changes, no nipple discharge or inversion, no axillary lymphadenopathy Gyn: Normal external genitalia without lesions, vagina with normal mucosa, cervix without lesions, no cervical motion tenderness, no abnormal vaginal discharge.  Uterus and adnexa not enlarged, nontender, no masses.  Pap performed.  Exam chaperoned by nurse. Rectal: deferred    Assessment and Plan :   Encounter Diagnoses  Name Primary?   Encounter for health maintenance examination in adult Yes   Impaired fasting blood sugar    Vaccine counseling    Screening mammogram for breast cancer    Screening for colon cancer    Osteopenia, unspecified location    Screening for cervical cancer     This visit was a preventative care visit, also known as wellness visit or routine physical.   Topics typically include healthy lifestyle, diet, exercise, preventative care, vaccinations, sick and well care, proper use of emergency dept and after hours care, as well as other concerns.     Recommendations: Continue to return yearly for your annual wellness and preventative care visits.  This gives [place] a chance to discuss healthy lifestyle, exercise, vaccinations, review your chart record, and perform screenings where appropriate.  I recommend you see your eye doctor yearly for routine vision care.  I recommend you see your dentist yearly  for routine dental care including hygiene visits twice yearly.   Vaccination recommendations were reviewed Immunization History  Administered Date(s) Administered   Influenza, Seasonal, Injecte, Preservative Fre 08/03/2014   Influenza,inj,Quad PF,6+ Mos 07/15/2018, 07/21/2019, 09/19/2021, 07/25/2022   Influenza-Unspecified 08/01/2020, 08/06/2023   PFIZER(Purple Top)SARS-COV-2 Vaccination 01/10/2020, 01/31/2020, 08/15/2020, 03/14/2021   PNEUMOCOCCAL CONJUGATE-20 09/25/2023   Pfizer Covid-19 Vaccine Bivalent Booster 2yrs & up 09/19/2021   Pfizer(Comirnaty)Fall Seasonal Vaccine 12 years and older 09/20/2022, 08/06/2023   Tdap 08/28/2018   Zoster Recombinant(Shingrix) 08/24/2018, 10/11/2021   Counseled on the pneumococcal vaccine.  Vaccine information sheet given.  Pneumococcal vaccine Prevnar 20 given after consent obtained.   Screening for cancer: Colon cancer screening: I reviewed your colonoscopy on file that is up to date from 2020 Will send home with fecal tube test today  Breast cancer screening: You should perform a self breast exam monthly.   We reviewed recommendations for regular mammograms and breast cancer screening.   Cervical cancer screening: We reviewed recommendations for pap smear screening.   Skin cancer screening: Check your skin regularly for new changes, growing lesions, or other lesions of concern Come in for evaluation if you have skin lesions of concern.  Lung cancer screening: If you have a greater  than 20 pack year history of tobacco use, then you may qualify for lung cancer screening with a chest CT scan.   Please call your insurance company to inquire about coverage for this test.  We currently don't have screenings for other cancers besides breast, cervical, colon, and lung cancers.  If you have a strong family history of cancer or have other cancer screening concerns, please let me know.    Bone health: Get at least 150 minutes of aerobic  exercise weekly Get weight bearing exercise at least once weekly Bone density test:  A bone density test is an imaging test that uses a type of X-ray to measure the amount of calcium and other minerals in your bones. The test may be used to diagnose or screen you for a condition that causes weak or thin bones (osteoporosis), predict your risk for a broken bone (fracture), or determine how well your osteoporosis treatment is working. The bone density test is recommended for females 65 and older, or females or males <65 if certain risk factors such as thyroid disease, long term use of steroids such as for asthma or rheumatological issues, vitamin D deficiency, estrogen deficiency, family history of osteoporosis, self or family history of fragility fracture in first degree relative.  I reviewed bone density test from 03/2023  Increase weight bearing exercise.    Heart health: Get at least 150 minutes of aerobic exercise weekly Limit alcohol It is important to maintain a healthy blood pressure and healthy cholesterol numbers  Heart disease screening: Screening for heart disease includes screening for blood pressure, fasting lipids, glucose/diabetes screening, BMI height to weight ratio, reviewed of smoking status, physical activity, and diet.    Goals include blood pressure 120/80 or less, maintaining a healthy lipid/cholesterol profile, preventing diabetes or keeping diabetes numbers under good control, not smoking or using tobacco products, exercising most days per week or at least 150 minutes per week of exercise, and eating healthy variety of fruits and vegetables, healthy oils, and avoiding unhealthy food choices like fried food, fast food, high sugar and high cholesterol foods.    I reviewed your CT coronary test from 03/2023 with score of zero.   Medical care options: I recommend you continue to seek care here first for routine care.  We try really hard to have available appointments  Monday through Friday daytime hours for sick visits, acute visits, and physicals.  Urgent care should be used for after hours and weekends for significant issues that cannot wait till the next day.  The emergency department should be used for significant potentially life-threatening emergencies.  The emergency department is expensive, can often have long wait times for less significant concerns, so try to utilize primary care, urgent care, or telemedicine when possible to avoid unnecessary trips to the emergency department.  Virtual visits and telemedicine have been introduced since the pandemic started in 2020, and can be convenient ways to receive medical care.  We offer virtual appointments as well to assist you in a variety of options to seek medical care.   Advanced Directives: I recommend you consider completing a Health Care Power of Attorney and Living Will.   These documents respect your wishes and help alleviate burdens on your loved ones if you were to become terminally ill or be in a position to need those documents enforced.    You can complete Advanced Directives yourself, have them notarized, then have copies made for our office, for you and for anybody you feel  should have them in safe keeping.  Or, you can have an attorney prepare these documents.   If you haven't updated your Last Will and Testament in a while, it may be worthwhile having an attorney prepare these documents together and save on some costs.      Separate significant chronic issues discussed: Impaired fasting glucose - updated labs today  Vit D deficiency -continue supplement, labs today  Osteopenia - counseled on need for weight bearing and aerobic exercise, c/t vit supplement, discussed calcium intake . Repeat bone density scan.   Kishia was seen today for annual exam.  Diagnoses and all orders for this visit:  Encounter for health maintenance examination in adult -     Comprehensive metabolic panel -      CBC -     Lipid panel -     VITAMIN D 25 Hydroxy (Vit-D Deficiency, Fractures) -     Hemoglobin A1c -     MM 3D SCREENING MAMMOGRAM BILATERAL BREAST -     Cytology - PAP([facility])  Impaired fasting blood sugar -     Hemoglobin A1c  Vaccine counseling -     Pneumococcal conjugate vaccine 20-valent (Prevnar 20)  Screening mammogram for breast cancer -     MM 3D SCREENING MAMMOGRAM BILATERAL BREAST  Screening for colon cancer -     Fecal occult blood, imunochemical  Osteopenia, unspecified location -     VITAMIN D 25 Hydroxy (Vit-D Deficiency, Fractures)  Screening for cervical cancer -     Cytology - PAP([facility])   Follow-up pending labs, yearly for physical

## 2023-09-26 ENCOUNTER — Other Ambulatory Visit: Payer: Self-pay | Admitting: Medical

## 2023-09-26 LAB — COMPREHENSIVE METABOLIC PANEL
ALT: 16 [IU]/L (ref 0–32)
AST: 21 IU/L (ref 0–40)
Albumin: 5 g/dL — ABNORMAL HIGH (ref 3.9–4.9)
Alkaline Phosphatase: 69 IU/L (ref 44–121)
BUN/Creatinine Ratio: 15 (ref 12–28)
BUN: 15 mg/dL (ref 8–27)
Bilirubin Total: 0.3 mg/dL (ref 0.0–1.2)
CO2: 21 mmol/L (ref 20–29)
Calcium: 10.1 mg/dL (ref 8.7–10.3)
Chloride: 102 mmol/L (ref 96–106)
Creatinine, Ser: 0.98 mg/dL (ref 0.57–1.00)
Globulin, Total: 3.2 g/dL (ref 1.5–4.5)
Glucose: 104 mg/dL — ABNORMAL HIGH (ref 70–99)
Potassium: 4.4 mmol/L (ref 3.5–5.2)
Sodium: 142 mmol/L (ref 134–144)
Total Protein: 8.2 g/dL (ref 6.0–8.5)
eGFR: 64 mL/min/{1.73_m2} (ref >59)

## 2023-09-26 LAB — CBC
Hematocrit: 44.6 % (ref 34.0–46.6)
Hemoglobin: 14.1 g/dL (ref 11.1–15.9)
MCH: 27.6 pg (ref 26.6–33.0)
MCHC: 31.6 g/dL (ref 31.5–35.7)
MCV: 88 fL (ref 79–97)
Platelets: 251 10*3/uL (ref 150–450)
RBC: 5.1 x10E6/uL (ref 3.77–5.28)
RDW: 13.5 % (ref 11.7–15.4)
WBC: 4.6 10*3/uL (ref 3.4–10.8)

## 2023-09-26 LAB — LIPID PANEL
Chol/HDL Ratio: 3.3 ratio (ref 0.0–4.4)
Cholesterol, Total: 182 mg/dL (ref 100–199)
HDL: 55 mg/dL (ref >39)
LDL Chol Calc (NIH): 115 mg/dL — ABNORMAL HIGH (ref 0–99)
Triglycerides: 62 mg/dL (ref 0–149)
VLDL Cholesterol Cal: 12 mg/dL (ref 5–40)

## 2023-09-26 LAB — VITAMIN D 25 HYDROXY (VIT D DEFICIENCY, FRACTURES): Vit D, 25-Hydroxy: 33.1 ng/mL (ref 30.0–100.0)

## 2023-09-26 LAB — HEMOGLOBIN A1C
Est. average glucose Bld gHb Est-mCnc: 128 mg/dL
Hgb A1c MFr Bld: 6.1 % — ABNORMAL HIGH (ref 4.8–5.6)

## 2023-09-26 MED ORDER — VITAMIN D (ERGOCALCIFEROL) 1.25 MG (50000 UNIT) PO CAPS: 50000.0000 [IU] | ORAL_CAPSULE | ORAL | 3 refills | Status: DC

## 2023-09-26 NOTE — Progress Notes (Signed)
Results sent through MyChart

## 2023-09-27 LAB — CYTOLOGY - PAP: Diagnosis: NEGATIVE

## 2023-09-27 NOTE — Progress Notes (Signed)
Results sent through MyChart

## 2023-10-02 LAB — FECAL OCCULT BLOOD, IMMUNOCHEMICAL: Fecal Occult Bld: NEGATIVE

## 2023-10-03 NOTE — Progress Notes (Signed)
Results sent through MyChart

## 2023-10-25 ENCOUNTER — Ambulatory Visit
Admission: RE | Admit: 2023-10-25 | Discharge: 2023-10-25 | Disposition: A | Discharge status: Home / Self Care | Payer: Commercial Managed Care - PPO | Source: Ambulatory Visit | Attending: Medical | Admitting: Medical

## 2024-06-30 ENCOUNTER — Other Ambulatory Visit: Payer: Self-pay | Admitting: Medical

## 2024-09-20 ENCOUNTER — Other Ambulatory Visit: Payer: Self-pay | Admitting: Medical

## 2024-09-30 ENCOUNTER — Encounter: Payer: Self-pay | Admitting: Medical

## 2024-09-30 ENCOUNTER — Ambulatory Visit (INDEPENDENT_AMBULATORY_CARE_PROVIDER_SITE_OTHER): Payer: Commercial Managed Care - PPO | Admitting: Medical

## 2024-09-30 VITALS — BP 120/70 | HR 56 | Ht 64.0 in | Wt 159.8 lb

## 2024-09-30 DIAGNOSIS — Z23 Encounter for immunization: Secondary | ICD-10-CM | POA: Diagnosis not present

## 2024-09-30 DIAGNOSIS — Z13220 Encounter for screening for lipoid disorders: Secondary | ICD-10-CM

## 2024-09-30 DIAGNOSIS — E559 Vitamin D deficiency, unspecified: Secondary | ICD-10-CM | POA: Diagnosis not present

## 2024-09-30 DIAGNOSIS — Z0000 Encounter for general adult medical examination without abnormal findings: Secondary | ICD-10-CM | POA: Diagnosis not present

## 2024-09-30 DIAGNOSIS — M8580 Other specified disorders of bone density and structure, unspecified site: Secondary | ICD-10-CM | POA: Diagnosis not present

## 2024-09-30 DIAGNOSIS — Z1389 Encounter for screening for other disorder: Secondary | ICD-10-CM

## 2024-09-30 DIAGNOSIS — R7301 Impaired fasting glucose: Secondary | ICD-10-CM

## 2024-09-30 DIAGNOSIS — Z780 Asymptomatic menopausal state: Secondary | ICD-10-CM

## 2024-09-30 DIAGNOSIS — L989 Disorder of the skin and subcutaneous tissue, unspecified: Secondary | ICD-10-CM

## 2024-09-30 DIAGNOSIS — Z136 Encounter for screening for cardiovascular disorders: Secondary | ICD-10-CM

## 2024-09-30 LAB — URINALYSIS, ROUTINE W REFLEX MICROSCOPIC
Bilirubin, UA: NEGATIVE
Glucose, UA: NEGATIVE
Ketones, UA: NEGATIVE
Leukocytes,UA: NEGATIVE
Nitrite, UA: NEGATIVE
Protein,UA: NEGATIVE
RBC, UA: NEGATIVE
Specific Gravity, UA: 1.02 (ref 1.005–1.030)
Urobilinogen, Ur: 0.2 mg/dL (ref 0.2–1.0)
pH, UA: 7 (ref 5.0–7.5)

## 2024-09-30 LAB — COMPREHENSIVE METABOLIC PANEL WITH GFR
ALT: 17 IU/L (ref 0–32)
AST: 20 IU/L (ref 0–40)
Albumin: 4.5 g/dL (ref 3.9–4.9)
Alkaline Phosphatase: 63 IU/L (ref 49–135)
BUN/Creatinine Ratio: 16 (ref 12–28)
BUN: 15 mg/dL (ref 8–27)
Bilirubin Total: 0.4 mg/dL (ref 0.0–1.2)
CO2: 23 mmol/L (ref 20–29)
Calcium: 9.7 mg/dL (ref 8.7–10.3)
Chloride: 103 mmol/L (ref 96–106)
Creatinine, Ser: 0.95 mg/dL (ref 0.57–1.00)
Globulin, Total: 3.2 g/dL (ref 1.5–4.5)
Glucose: 111 mg/dL — ABNORMAL HIGH (ref 70–99)
Potassium: 4.3 mmol/L (ref 3.5–5.2)
Sodium: 141 mmol/L (ref 134–144)
Total Protein: 7.7 g/dL (ref 6.0–8.5)
eGFR: 66 mL/min/1.73 (ref >59)

## 2024-09-30 LAB — LIPID PANEL
Chol/HDL Ratio: 3 ratio (ref 0.0–4.4)
Cholesterol, Total: 175 mg/dL (ref 100–199)
HDL: 59 mg/dL (ref >39)
LDL Chol Calc (NIH): 104 mg/dL — ABNORMAL HIGH (ref 0–99)
Triglycerides: 64 mg/dL (ref 0–149)
VLDL Cholesterol Cal: 12 mg/dL (ref 5–40)

## 2024-09-30 LAB — CBC
Hematocrit: 44.6 % (ref 34.0–46.6)
Hemoglobin: 13.8 g/dL (ref 11.1–15.9)
MCH: 27 pg (ref 26.6–33.0)
MCHC: 30.9 g/dL — ABNORMAL LOW (ref 31.5–35.7)
MCV: 87 fL (ref 79–97)
Platelets: 261 x10E3/uL (ref 150–450)
RBC: 5.11 x10E6/uL (ref 3.77–5.28)
RDW: 13.2 % (ref 11.7–15.4)
WBC: 4.2 x10E3/uL (ref 3.4–10.8)

## 2024-09-30 LAB — HEMOGLOBIN A1C
Est. average glucose Bld gHb Est-mCnc: 120 mg/dL
Hgb A1c MFr Bld: 5.8 % — ABNORMAL HIGH (ref 4.8–5.6)

## 2024-09-30 NOTE — Progress Notes (Signed)
 Subjective:   HPI  Megan Mccoy is a 68 y.o. female who presents for Chief Complaint  Patient presents with   Annual Exam    Fasting cpe, has some knots on legs    Medical care team includes: Megan Mccoy, Megan Mccoy here for primary care Dentist Eye doctor Dr. Suzen Brass, GI Dr. Donnice Fees, podiatry   Concerns: Megan Mccoy is a 68 year old female who presents for a wellness visit.  She has concerns about skin lesions on her legs, with one on the right leg present for years and another on the left leg appearing more recently. She is unsure if they are skin tags.  She recently removed a splinter from her right hand about a week ago and reports no current pain or signs of infection. She uses Aquaphor as a moisturizer  Her past medical history includes breast biopsy, colonoscopies, foot surgery, tubal ligation, and uterine fibroid surgery. She has a history of low bone mass (osteopenia) and takes vitamin D  and a multivitamin.   She works from home and engages in some physical activity at home due to her husband's situation with prior injury. She acknowledges that her diet may not be very healthy. She does not smoke, consume alcohol, or use drugs.  No chest pain or difficulty breathing.   Reviewed their medical, surgical, family, social, medication, and allergy history and updated chart as appropriate.  Past Medical History:  Diagnosis Date   Allergy    Vitamin D  deficiency 2019    Past Surgical History:  Procedure Laterality Date   BREAST EXCISIONAL BIOPSY Left    COLONOSCOPY     age 62yo   COLONOSCOPY  05/26/2019   mild melanosis coli, otherwise normal.   Dr. Suzen Brass   FOOT SURGERY Left 10/2020   bone spur removal left foot   TUBAL LIGATION     UTERINE FIBROID SURGERY       Family History  Problem Relation Age of Onset   Diabetes Mother    Heart disease Mother 82       MI   Cancer Father        lung   Lung cancer Father     Asthma Sister    Colon polyps Sister    Dementia Sister    Cancer Brother        brain and lung, prior smoker   Cancer Brother        lung, smoker   Lung cancer Brother    Heart disease Brother        died in 68s of hole in the heart   Breast cancer Paternal Grandmother    Colon cancer Neg Hx    Esophageal cancer Neg Hx    Rectal cancer Neg Hx    Stomach cancer Neg Hx      Current Outpatient Medications:    Multiple Vitamins-Minerals (MULTIVITAMIN WITH MINERALS) tablet, Take 1 tablet by mouth daily. , Disp: , Rfl:    Vitamin D , Ergocalciferol , (DRISDOL ) 1.25 MG (50000 UNIT) CAPS capsule, TAKE 1 CAPSULE (50,000 UNITS TOTAL) BY MOUTH EVERY 7 (SEVEN) DAYS, Disp: 12 capsule, Rfl: 0  No Known Allergies   Review of Systems  Constitutional:  Negative for chills, fever, malaise/fatigue and weight loss.  HENT:  Negative for congestion, ear pain, hearing loss, sore throat and tinnitus.   Eyes:  Negative for blurred vision, pain and redness.  Respiratory:  Negative for cough, hemoptysis and shortness of breath.   Cardiovascular:  Negative for chest pain, palpitations, orthopnea, claudication and leg swelling.  Gastrointestinal:  Negative for abdominal pain, blood in stool, constipation, diarrhea, nausea and vomiting.  Genitourinary:  Negative for dysuria, flank pain, frequency, hematuria and urgency.  Musculoskeletal:  Negative for falls, joint pain and myalgias.  Skin:  Negative for itching and rash.       Skin lesions on legs  Neurological:  Negative for dizziness, tingling, speech change, weakness and headaches.  Endo/Heme/Allergies:  Negative for polydipsia. Does not bruise/bleed easily.  Psychiatric/Behavioral:  Negative for depression and memory loss. The patient is not nervous/anxious and does not have insomnia.       Objective:  BP 120/70   Pulse (!) 56   Ht 5' 4 (1.626 m)   Wt 159 lb 12.8 oz (72.5 kg)   SpO2 97%   BMI 27.43 kg/m   Wt Readings from Last 3 Encounters:   09/30/24 159 lb 12.8 oz (72.5 kg)  09/25/23 157 lb 12.8 oz (71.6 kg)  09/20/22 157 lb 6.4 oz (71.4 kg)   General appearance: alert, no distress, WD/WN,lean African American female Skin:  right anterior superior thigh with 4mm diameter brown dense lesions suggestive of granuloma,  left upper anterior thigh with 3mm x 2mm cherry angioma, no other worrisome lesions seen, both lesions with regular borders, no unusual colors or multicolor no worrisome lesions HEENT: normocephalic, conjunctiva/corneas normal, sclerae anicteric, PERRLA, EOMi Neck: supple, no lymphadenopathy, no thyromegaly, no masses, normal ROM, no bruits Chest: non tender, normal shape and expansion Heart: RRR, normal S1, S2, no murmurs Lungs: CTA bilaterally, no wheezes, rhonchi, or rales Abdomen: +bs, soft, non tender, non distended, no masses, no hepatomegaly, no splenomegaly, no bruits Back: non tender, normal ROM, no scoliosis Musculoskeletal: upper extremities non tender, no obvious deformity, normal ROM throughout, lower extremities non tender, no obvious deformity, normal ROM throughout Extremities: no edema, no cyanosis, no clubbing Pulses: 2+ symmetric, upper and lower extremities, normal cap refill Neurological: alert, oriented x 3, CN2-12 intact, strength normal upper extremities and lower extremities, sensation normal throughout, DTRs 2+ throughout, no cerebellar signs, gait normal Psychiatric: normal affect, behavior normal, pleasant   Breast/gyn/rectal - deferred   Assessment and Plan :   Encounter Diagnoses  Name Primary?   Encounter for health maintenance examination in adult Yes   Needs flu shot    COVID-19 vaccine administered    Osteopenia, unspecified location    Vitamin D  deficiency    Post-menopausal    Skin lesion    Encounter for lipid screening for cardiovascular disease    Screening for hematuria or proteinuria    Impaired fasting blood sugar     This visit was a preventative care visit,  also known as wellness visit or routine physical.   Topics typically include healthy lifestyle, diet, exercise, preventative care, vaccinations, sick and well care, proper use of emergency dept and after hours care, as well as other concerns.     Recommendations: Continue to return yearly for your annual wellness and preventative care visits.  This gives us  a chance to discuss healthy lifestyle, exercise, vaccinations, review your chart record, and perform screenings where appropriate.  I recommend you see your eye doctor yearly for routine vision care.  I recommend you see your dentist yearly for routine dental care including hygiene visits twice yearly.   Vaccination recommendations were reviewed Immunization History  Administered Date(s) Administered   INFLUENZA, HIGH DOSE SEASONAL PF 09/30/2024   Influenza, Seasonal, Injecte, Preservative Fre 08/03/2014  Influenza,inj,Quad PF,6+ Mos 07/15/2018, 07/21/2019, 09/19/2021, 07/25/2022   Influenza-Unspecified 08/01/2020, 08/06/2023   PFIZER(Purple Top)SARS-COV-2 Vaccination 01/10/2020, 01/31/2020, 08/15/2020, 03/14/2021   PNEUMOCOCCAL CONJUGATE-20 09/25/2023   Pfizer Covid-19 Vaccine Bivalent Booster 38yrs & up 09/19/2021   Pfizer(Comirnaty)Fall Seasonal Vaccine 12 years and older 09/20/2022, 08/06/2023, 09/30/2024   Tdap 08/28/2018   Zoster Recombinant(Shingrix) 08/24/2018, 10/11/2021   Counseled on the influenza virus vaccine.   Influenza vaccine given after consent obtained.  Counseled on the covid virus vaccine.   Covid vaccine given after consent obtained.   Screening for cancer: Colon cancer screening: I reviewed your colonoscopy on file that is up to date from 2020 Repeat colonoscopy 2030.  FIT test negative 2024.  Breast cancer screening: You should perform a self breast exam monthly.   We reviewed recommendations for regular mammograms and breast cancer screening.   Cervical cancer screening: We reviewed recommendations  for pap smear screening. Pap 09/2023 normal  Skin cancer screening: Check your skin regularly for new changes, growing lesions, or other lesions of concern Come in for evaluation if you have skin lesions of concern.  Lung cancer screening: If you have a greater than 20 pack year history of tobacco use, then you may qualify for lung cancer screening with a chest CT scan.   Please call your insurance company to inquire about coverage for this test.  We currently don't have screenings for other cancers besides breast, cervical, colon, and lung cancers.  If you have a strong family history of cancer or have other cancer screening concerns, please let me know.    Bone health: Get at least 150 minutes of aerobic exercise weekly Get weight bearing exercise at least once weekly Bone density test:  A bone density test is an imaging test that uses a type of X-ray to measure the amount of calcium and other minerals in your bones. The test may be used to diagnose or screen you for a condition that causes weak or thin bones (osteoporosis), predict your risk for a broken bone (fracture), or determine how well your osteoporosis treatment is working. The bone density test is recommended for females 65 and older, or females or males <65 if certain risk factors such as thyroid  disease, long term use of steroids such as for asthma or rheumatological issues, vitamin D  deficiency, estrogen deficiency, family history of osteoporosis, self or family history of fragility fracture in first degree relative.  I reviewed bone density test from 03/2023, osteopenia Increase weight bearing exercise.    Heart health: Get at least 150 minutes of aerobic exercise weekly Limit alcohol It is important to maintain a healthy blood pressure and healthy cholesterol numbers  Heart disease screening: Screening for heart disease includes screening for blood pressure, fasting lipids, glucose/diabetes screening, BMI height to  weight ratio, reviewed of smoking status, physical activity, and diet.    Goals include blood pressure 120/80 or less, maintaining a healthy lipid/cholesterol profile, preventing diabetes or keeping diabetes numbers under good control, not smoking or using tobacco products, exercising most days per week or at least 150 minutes per week of exercise, and eating healthy variety of fruits and vegetables, healthy oils, and avoiding unhealthy food choices like fried food, fast food, high sugar and high cholesterol foods.    I reviewed your CT coronary test from 03/2023 with score of zero.   Medical care options: I recommend you continue to seek care here first for routine care.  We try really hard to have available appointments Monday through Friday  daytime hours for sick visits, acute visits, and physicals.  Urgent care should be used for after hours and weekends for significant issues that cannot wait till the next day.  The emergency department should be used for significant potentially life-threatening emergencies.  The emergency department is expensive, can often have long wait times for less significant concerns, so try to utilize primary care, urgent care, or telemedicine when possible to avoid unnecessary trips to the emergency department.  Virtual visits and telemedicine have been introduced since the pandemic started in 2020, and can be convenient ways to receive medical care.  We offer virtual appointments as well to assist you in a variety of options to seek medical care.   Advanced Directives: I recommend you consider completing a Health Care Power of Attorney and Living Will.   These documents respect your wishes and help alleviate burdens on your loved ones if you were to become terminally ill or be in a position to need those documents enforced.    You can complete Advanced Directives yourself, have them notarized, then have copies made for our office, for you and for anybody you feel should  have them in safe keeping.  Or, you can have an attorney prepare these documents.   If you haven't updated your Last Will and Testament in a while, it may be worthwhile having an attorney prepare these documents together and save on some costs.      Separate significant chronic issues discussed: Impaired fasting glucose - updated labs today  Vit D deficiency -continue supplement  Osteopenia - counseled on need for weight bearing and aerobic exercise, c/t vit supplement, discussed calcium intake .   Skin lesions - reassured, no worrisome findings.  Can return for excision if desired  Felicity was seen today for annual exam.  Diagnoses and all orders for this visit:  Encounter for health maintenance examination in adult -     CBC -     Comprehensive metabolic panel with GFR -     Lipid panel -     Urinalysis, Routine w reflex microscopic -     Hemoglobin A1c  Needs flu shot -     Flu vaccine HIGH DOSE PF(Fluzone Trivalent)  COVID-19 vaccine administered -     Pfizer Comirnaty Covid-19 Vaccine 64yrs & older  Osteopenia, unspecified location  Vitamin D  deficiency  Post-menopausal  Skin lesion  Encounter for lipid screening for cardiovascular disease -     Lipid panel  Screening for hematuria or proteinuria -     Urinalysis, Routine w reflex microscopic  Impaired fasting blood sugar -     Hemoglobin A1c   Follow-up pending labs, yearly for physical

## 2024-09-30 NOTE — Patient Instructions (Signed)
 This visit was a preventative care visit, also known as wellness visit or routine physical.   Topics typically include healthy lifestyle, diet, exercise, preventative care, vaccinations, sick and well care, proper use of emergency dept and after hours care, as well as other concerns.     Recommendations: Continue to return yearly for your annual wellness and preventative care visits.  This gives us  a chance to discuss healthy lifestyle, exercise, vaccinations, review your chart record, and perform screenings where appropriate.  I recommend you see your eye doctor yearly for routine vision care.  I recommend you see your dentist yearly for routine dental care including hygiene visits twice yearly.   Vaccination recommendations were reviewed Immunization History  Administered Date(s) Administered   Influenza, Seasonal, Injecte, Preservative Fre 08/03/2014   Influenza,inj,Quad PF,6+ Mos 07/15/2018, 07/21/2019, 09/19/2021, 07/25/2022   Influenza-Unspecified 08/01/2020, 08/06/2023   PFIZER(Purple Top)SARS-COV-2 Vaccination 01/10/2020, 01/31/2020, 08/15/2020, 03/14/2021   PNEUMOCOCCAL CONJUGATE-20 09/25/2023   Pfizer Covid-19 Vaccine Bivalent Booster 32yrs & up 09/19/2021   Pfizer(Comirnaty)Fall Seasonal Vaccine 12 years and older 09/20/2022, 08/06/2023   Tdap 08/28/2018   Zoster Recombinant(Shingrix) 08/24/2018, 10/11/2021   Counseled on the influenza virus vaccine.   Influenza vaccine given after consent obtained.  Counseled on the covid virus vaccine.   Covid vaccine given after consent obtained.   Screening for cancer: Colon cancer screening: I reviewed your colonoscopy on file that is up to date from 2020 Repeat colonoscopy 2030.  FIT test negative 2024.  Breast cancer screening: You should perform a self breast exam monthly.   We reviewed recommendations for regular mammograms and breast cancer screening.   Cervical cancer screening: We reviewed recommendations for pap smear  screening. Pap 09/2023 normal  Skin cancer screening: Check your skin regularly for new changes, growing lesions, or other lesions of concern Come in for evaluation if you have skin lesions of concern.  Lung cancer screening: If you have a greater than 20 pack year history of tobacco use, then you may qualify for lung cancer screening with a chest CT scan.   Please call your insurance company to inquire about coverage for this test.  We currently don't have screenings for other cancers besides breast, cervical, colon, and lung cancers.  If you have a strong family history of cancer or have other cancer screening concerns, please let me know.    Bone health: Get at least 150 minutes of aerobic exercise weekly Get weight bearing exercise at least once weekly Bone density test:  A bone density test is an imaging test that uses a type of X-ray to measure the amount of calcium and other minerals in your bones. The test may be used to diagnose or screen you for a condition that causes weak or thin bones (osteoporosis), predict your risk for a broken bone (fracture), or determine how well your osteoporosis treatment is working. The bone density test is recommended for females 65 and older, or females or males <65 if certain risk factors such as thyroid  disease, long term use of steroids such as for asthma or rheumatological issues, vitamin D  deficiency, estrogen deficiency, family history of osteoporosis, self or family history of fragility fracture in first degree relative.  I reviewed bone density test from 03/2023, osteopenia Increase weight bearing exercise.    Heart health: Get at least 150 minutes of aerobic exercise weekly Limit alcohol It is important to maintain a healthy blood pressure and healthy cholesterol numbers  Heart disease screening: Screening for heart disease includes screening for  blood pressure, fasting lipids, glucose/diabetes screening, BMI height to weight ratio,  reviewed of smoking status, physical activity, and diet.    Goals include blood pressure 120/80 or less, maintaining a healthy lipid/cholesterol profile, preventing diabetes or keeping diabetes numbers under good control, not smoking or using tobacco products, exercising most days per week or at least 150 minutes per week of exercise, and eating healthy variety of fruits and vegetables, healthy oils, and avoiding unhealthy food choices like fried food, fast food, high sugar and high cholesterol foods.    I reviewed your CT coronary test from 03/2023 with score of zero.   Medical care options: I recommend you continue to seek care here first for routine care.  We try really hard to have available appointments Monday through Friday daytime hours for sick visits, acute visits, and physicals.  Urgent care should be used for after hours and weekends for significant issues that cannot wait till the next day.  The emergency department should be used for significant potentially life-threatening emergencies.  The emergency department is expensive, can often have long wait times for less significant concerns, so try to utilize primary care, urgent care, or telemedicine when possible to avoid unnecessary trips to the emergency department.  Virtual visits and telemedicine have been introduced since the pandemic started in 2020, and can be convenient ways to receive medical care.  We offer virtual appointments as well to assist you in a variety of options to seek medical care.   Advanced Directives: I recommend you consider completing a Health Care Power of Attorney and Living Will.   These documents respect your wishes and help alleviate burdens on your loved ones if you were to become terminally ill or be in a position to need those documents enforced.    You can complete Advanced Directives yourself, have them notarized, then have copies made for our office, for you and for anybody you feel should have them in  safe keeping.  Or, you can have an attorney prepare these documents.   If you haven't updated your Last Will and Testament in a while, it may be worthwhile having an attorney prepare these documents together and save on some costs.      Separate significant chronic issues discussed: Impaired fasting glucose - updated labs today  Vit D deficiency -continue supplement  Osteopenia - counseled on need for weight bearing and aerobic exercise, c/t vit supplement, discussed calcium intake .   Skin lesions - reassured, no worrisome findings.  Can return for excision if desired

## 2024-10-01 ENCOUNTER — Ambulatory Visit: Payer: Self-pay | Admitting: Medical

## 2024-10-01 NOTE — Progress Notes (Signed)
 Results through MyChart

## 2024-11-25 ENCOUNTER — Ambulatory Visit: Admitting: *Deleted

## 2024-11-25 VITALS — Ht 64.0 in | Wt 159.0 lb

## 2024-11-25 DIAGNOSIS — Z0000 Encounter for general adult medical examination without abnormal findings: Secondary | ICD-10-CM

## 2024-11-25 NOTE — Progress Notes (Signed)
 "  Chief Complaint  Patient presents with   Medicare Wellness     Subjective:   Megan Mccoy is a 69 y.o. female who presents for a Medicare Annual Wellness Visit.  No voiced or noted concerns at this time   Visit info / Clinical Intake: Medicare Wellness Visit Type:: Initial Annual Wellness Visit Persons participating in visit and providing information:: patient Medicare Wellness Visit Mode:: Telephone If telephone:: video declined Since this visit was completed virtually, some vitals may be partially provided or unavailable. Missing vitals are due to the limitations of the virtual format.: Unable to obtain vitals - no equipment If Telephone or Video please confirm:: I connected with patient using audio/video enable telemedicine. I verified patient identity with two identifiers, discussed telehealth limitations, and patient agreed to proceed. Patient Location:: home Provider Location:: home Interpreter Needed?: No Pre-visit prep was completed: no AWV questionnaire completed by patient prior to visit?: no Living arrangements:: lives with spouse/significant other Patient's Overall Health Status Rating: good Typical amount of pain: none Does pain affect daily life?: no Are you currently prescribed opioids?: no  Dietary Habits and Nutritional Risks How many meals a day?: 3 Eats fruit and vegetables daily?: yes Most meals are obtained by: preparing own meals In the last 2 weeks, have you had any of the following?: none Diabetic:: no  Functional Status Activities of Daily Living (to include ambulation/medication): Independent Ambulation: Independent Medication Administration: Independent Home Management (perform basic housework or laundry): Independent Manage your own finances?: yes Primary transportation is: driving Concerns about vision?: no *vision screening is required for WTM* Concerns about hearing?: no  Fall Screening Falls in the past year?: 0 Number of falls  in past year: 0 Was there an injury with Fall?: 0 Fall Risk Category Calculator: 0 Patient Fall Risk Level: Low Fall Risk  Fall Risk Patient at Risk for Falls Due to: No Fall Risks Fall risk Follow up: Falls evaluation completed; Education provided; Falls prevention discussed  Home and Transportation Safety: All rugs have non-skid backing?: (!) no All stairs or steps have railings?: N/A, no stairs Grab bars in the bathtub or shower?: (!) no Have non-skid surface in bathtub or shower?: (!) no Good home lighting?: yes Regular seat belt use?: yes Hospital stays in the last year:: no  Cognitive Assessment Difficulty concentrating, remembering, or making decisions? : no Will 6CIT or Mini Cog be Completed: yes What year is it?: 0 points What month is it?: 0 points Give patient an address phrase to remember (5 components): Its very sunny outside today in January About what time is it?: 0 points Count backwards from 20 to 1: 0 points Say the months of the year in reverse: 0 points Repeat the address phrase from earlier: 0 points 6 CIT Score: 0 points  Advance Directives (For Healthcare) Does Patient Have a Medical Advance Directive?: Yes Type of Advance Directive: Healthcare Power of Attorney Copy of Healthcare Power of Attorney in Chart?: No - copy requested  Reviewed/Updated  Reviewed/Updated: Reviewed All (Medical, Surgical, Family, Medications, Allergies, Care Teams, Patient Goals); Surgical History; Family History; Medications; Care Teams; Patient Goals; Medical History; Allergies    Allergies (verified) Patient has no known allergies.   Current Medications (verified) Outpatient Encounter Medications as of 11/25/2024  Medication Sig   Multiple Vitamins-Minerals (MULTIVITAMIN WITH MINERALS) tablet Take 1 tablet by mouth daily.    Vitamin D , Ergocalciferol , (DRISDOL ) 1.25 MG (50000 UNIT) CAPS capsule TAKE 1 CAPSULE (50,000 UNITS TOTAL) BY MOUTH EVERY 7 (SEVEN)  DAYS   No  facility-administered encounter medications on file as of 11/25/2024.    History: Past Medical History:  Diagnosis Date   Allergy    Vitamin D  deficiency 2019   Past Surgical History:  Procedure Laterality Date   BREAST EXCISIONAL BIOPSY Left    COLONOSCOPY     age 52yo   COLONOSCOPY  05/26/2019   mild melanosis coli, otherwise normal.   Dr. Suzen Brass   FOOT SURGERY Left 10/2020   bone spur removal left foot   TUBAL LIGATION     UTERINE FIBROID SURGERY     Family History  Problem Relation Age of Onset   Diabetes Mother    Heart disease Mother 68       MI   Cancer Father        lung   Lung cancer Father    Asthma Sister    Colon polyps Sister    Dementia Sister    Cancer Brother        brain and lung, prior smoker   Cancer Brother        lung, smoker   Lung cancer Brother    Heart disease Brother        died in 7s of hole in the heart   Breast cancer Paternal Grandmother    Colon cancer Neg Hx    Esophageal cancer Neg Hx    Rectal cancer Neg Hx    Stomach cancer Neg Hx    Social History   Occupational History   Not on file  Tobacco Use   Smoking status: Never   Smokeless tobacco: Never  Vaping Use   Vaping status: Never Used  Substance and Sexual Activity   Alcohol use: Yes    Alcohol/week: 1.0 standard drink of alcohol    Types: 1 Glasses of wine per week   Drug use: Never   Sexual activity: Not Currently   Tobacco Counseling Counseling given: Not Answered  SDOH Screenings   Food Insecurity: No Food Insecurity (11/25/2024)  Housing: Low Risk (11/25/2024)  Transportation Needs: No Transportation Needs (11/25/2024)  Utilities: Not At Risk (11/25/2024)  Depression (PHQ2-9): Low Risk (11/25/2024)  Financial Resource Strain: Medium Risk (09/30/2024)  Physical Activity: Insufficiently Active (11/25/2024)  Social Connections: Socially Integrated (11/25/2024)  Stress: No Stress Concern Present (11/25/2024)  Tobacco Use: Low Risk (11/25/2024)  Health  Literacy: Adequate Health Literacy (11/25/2024)   See flowsheets for full screening details  Depression Screen PHQ 2 & 9 Depression Scale- Over the past 2 weeks, how often have you been bothered by any of the following problems? Little interest or pleasure in doing things: 0 Feeling down, depressed, or hopeless (PHQ Adolescent also includes...irritable): 0 PHQ-2 Total Score: 0 Trouble falling or staying asleep, or sleeping too much: 0 Feeling tired or having little energy: 0 Poor appetite or overeating (PHQ Adolescent also includes...weight loss): 0 Feeling bad about yourself - or that you are a failure or have let yourself or your family down: 0 Trouble concentrating on things, such as reading the newspaper or watching television (PHQ Adolescent also includes...like school work): 0 Moving or speaking so slowly that other people could have noticed. Or the opposite - being so fidgety or restless that you have been moving around a lot more than usual: 0 Thoughts that you would be better off dead, or of hurting yourself in some way: 0 PHQ-9 Total Score: 0 If you checked off any problems, how difficult have these problems made it for you  to do your work, take care of things at home, or get along with other people?: Not difficult at all     Goals Addressed             This Visit's Progress    Patient Stated       Fleurette building house and get moved in             Objective:    Today's Vitals   11/25/24 1339  Weight: 159 lb (72.1 kg)  Height: 5' 4 (1.626 m)   Body mass index is 27.29 kg/m.  Hearing/Vision screen Hearing Screening - Comments:: No trouble hearing Vision Screening - Comments:: Not up to date Education provided Immunizations and Health Maintenance Health Maintenance  Topic Date Due   COVID-19 Vaccine (9 - 2025-26 season) 03/30/2025   Mammogram  10/24/2025   Medicare Annual Wellness (AWV)  11/25/2025   DTaP/Tdap/Td (2 - Td or Tdap) 08/28/2028    Colonoscopy  05/25/2029   Pneumococcal Vaccine: 50+ Years  Completed   Influenza Vaccine  Completed   Bone Density Scan  Completed   Hepatitis C Screening  Completed   Zoster Vaccines- Shingrix  Completed   Meningococcal B Vaccine  Aged Out        Assessment/Plan:  This is a routine wellness examination for Megan Mccoy.  Patient Care Team: Tysinger, Alm RAMAN, PA-C as PCP - General (Family Medicine)  I have personally reviewed and noted the following in the patients chart:   Medical and social history Use of alcohol, tobacco or illicit drugs  Current medications and supplements including opioid prescriptions. Functional ability and status Nutritional status Physical activity Advanced directives List of other physicians Hospitalizations, surgeries, and ER visits in previous 12 months Vitals Screenings to include cognitive, depression, and falls Referrals and appointments  No orders of the defined types were placed in this encounter.  In addition, I have reviewed and discussed with patient certain preventive protocols, quality metrics, and best practice recommendations. A written personalized care plan for preventive services as well as general preventive health recommendations were provided to patient.   Mliss Graff, LPN   8/79/7973   Return in 1 year (on 11/25/2025).  After Visit Summary: (MyChart) Due to this being a telephonic visit, the after visit summary with patients personalized plan was offered to patient via MyChart   Nurse Notes:  "

## 2024-11-25 NOTE — Patient Instructions (Signed)
 "   [name],  Thank you for taking the time for your Medicare Wellness Visit. I appreciate your continued commitment to your health goals. Please review the care plan we discussed, and feel free to reach out if I can assist you further.  Please note that Annual Wellness Visits do not include a physical exam. Some assessments may be limited, especially if the visit was conducted virtually. If needed, we may recommend an in-person follow-up with your provider.  Ongoing Care Seeing your primary care provider every 3 to 6 months helps us  monitor your health and provide consistent, personalized care.  Referrals If a referral was made during today's visit and you haven't received any updates within two weeks, please contact the referred provider directly to check on the status.  Recommended Screenings:  Health Maintenance  Topic Date Due   COVID-19 Vaccine (9 - 2025-26 season) 03/30/2025   Breast Cancer Screening  10/24/2025   Medicare Annual Wellness Visit  11/25/2025   DTaP/Tdap/Td vaccine (2 - Td or Tdap) 08/28/2028   Colon Cancer Screening  05/25/2029   Pneumococcal Vaccine for age over 61  Completed   Flu Shot  Completed   Osteoporosis screening with Bone Density Scan  Completed   Hepatitis C Screening  Completed   Zoster (Shingles) Vaccine  Completed   Meningitis B Vaccine  Aged Out       11/25/2024    1:34 PM  Advanced Directives  Does Patient Have a Medical Advance Directive? Yes  Type of Advance Directive Healthcare Power of Attorney  Copy of Healthcare Power of Attorney in Chart? No - copy requested    Vision: Annual vision screenings are recommended for early detection of glaucoma, cataracts, and diabetic retinopathy. These exams can also reveal signs of chronic conditions such as diabetes and high blood pressure.  Dental: Annual dental screenings help detect early signs of oral cancer, gum disease, and other conditions linked to overall health, including heart  disease and diabetes.  Please see the attached documents for additional preventive care recommendations.    [name] , Thank you for taking time to come for your Medicare Wellness Visit. I appreciate your ongoing commitment to your health goals. Please review the following plan we discussed and let me know if I can assist you in the future.   Screening recommendations/referrals: Colonoscopy:  Mammogram:  Bone Density:  Recommended yearly ophthalmology/optometry visit for glaucoma screening and checkup Recommended yearly dental visit for hygiene and checkup  Vaccinations: Influenza vaccine:  Pneumococcal vaccine:  Tdap vaccine:  Shingles vaccine:       Preventive Care 65 Years and Older, Female Preventive care refers to lifestyle choices and visits with your health care provider that can promote health and wellness. What does preventive care include? A yearly physical exam. This is also called an annual well check. Dental exams once or twice a year. Routine eye exams. Ask your health care provider how often you should have your eyes checked. Personal lifestyle choices, including: Daily care of your teeth and gums. Regular physical activity. Eating a healthy diet. Avoiding tobacco and drug use. Limiting alcohol use. Practicing safe sex. Taking low-dose aspirin every day. Taking vitamin and mineral supplements as recommended by your health care provider. What happens during an annual well check? The services and screenings done by your health care provider during your annual well check will depend on your age, overall health, lifestyle risk factors, and family history of disease. Counseling  Your health care provider may  ask you questions about your: Alcohol use. Tobacco use. Drug use. Emotional well-being. Home and relationship well-being. Sexual activity. Eating habits. History of falls. Memory and ability to understand (cognition). Work and work  astronomer. Reproductive health. Screening  You may have the following tests or measurements: Height, weight, and BMI. Blood pressure. Lipid and cholesterol levels. These may be checked every 5 years, or more frequently if you are over 66 years old. Skin check. Lung cancer screening. You may have this screening every year starting at age 53 if you have a 30-pack-year history of smoking and currently smoke or have quit within the past 15 years. Fecal occult blood test (FOBT) of the stool. You may have this test every year starting at age 64. Flexible sigmoidoscopy or colonoscopy. You may have a sigmoidoscopy every 5 years or a colonoscopy every 10 years starting at age 22. Hepatitis C blood test. Hepatitis B blood test. Sexually transmitted disease (STD) testing. Diabetes screening. This is done by checking your blood sugar (glucose) after you have not eaten for a while (fasting). You may have this done every 1-3 years. Bone density scan. This is done to screen for osteoporosis. You may have this done starting at age 27. Mammogram. This may be done every 1-2 years. Talk to your health care provider about how often you should have regular mammograms. Talk with your health care provider about your test results, treatment options, and if necessary, the need for more tests. Vaccines  Your health care provider may recommend certain vaccines, such as: Influenza vaccine. This is recommended every year. Tetanus, diphtheria, and acellular pertussis (Tdap, Td) vaccine. You may need a Td booster every 10 years. Zoster vaccine. You may need this after age 84. Pneumococcal 13-valent conjugate (PCV13) vaccine. One dose is recommended after age 30. Pneumococcal polysaccharide (PPSV23) vaccine. One dose is recommended after age 66. Talk to your health care provider about which screenings and vaccines you need and how often you need them. This information is not intended to replace advice given to you by  your health care provider. Make sure you discuss any questions you have with your health care provider. Document Released: 11/19/2015 Document Revised: 07/12/2016 Document Reviewed: 08/24/2015 Elsevier Interactive Patient Education  2017 [employer].  Fall Prevention in the Home Falls can cause injuries. They can happen to people of all ages. There are many things you can do to make your home safe and to help prevent falls. What can I do on the outside of my home? Regularly fix the edges of walkways and driveways and fix any cracks. Remove anything that might make you trip as you walk through a door, such as a raised step or threshold. Trim any bushes or trees on the path to your home. Use bright outdoor lighting. Clear any walking paths of anything that might make someone trip, such as rocks or tools. Regularly check to see if handrails are loose or broken. Make sure that both sides of any steps have handrails. Any raised decks and porches should have guardrails on the edges. Have any leaves, snow, or ice cleared regularly. Use sand or salt on walking paths during winter. Clean up any spills in your garage right away. This includes oil or grease spills. What can I do in the bathroom? Use night lights. Install grab bars by the toilet and in the tub and shower. Do not use towel bars as grab bars. Use non-skid mats or decals in the tub or shower. If you need  to sit down in the shower, use a plastic, non-slip stool. Keep the floor dry. Clean up any water that spills on the floor as soon as it happens. Remove soap buildup in the tub or shower regularly. Attach bath mats securely with double-sided non-slip rug tape. Do not have throw rugs and other things on the floor that can make you trip. What can I do in the bedroom? Use night lights. Make sure that you have a light by your bed that is easy to reach. Do not use any sheets or blankets that are too big for your bed. They should not hang  down onto the floor. Have a firm chair that has side arms. You can use this for support while you get dressed. Do not have throw rugs and other things on the floor that can make you trip. What can I do in the kitchen? Clean up any spills right away. Avoid walking on wet floors. Keep items that you use a lot in easy-to-reach places. If you need to reach something above you, use a strong step stool that has a grab bar. Keep electrical cords out of the way. Do not use floor polish or wax that makes floors slippery. If you must use wax, use non-skid floor wax. Do not have throw rugs and other things on the floor that can make you trip. What can I do with my stairs? Do not leave any items on the stairs. Make sure that there are handrails on both sides of the stairs and use them. Fix handrails that are broken or loose. Make sure that handrails are as long as the stairways. Check any carpeting to make sure that it is firmly attached to the stairs. Fix any carpet that is loose or worn. Avoid having throw rugs at the top or bottom of the stairs. If you do have throw rugs, attach them to the floor with carpet tape. Make sure that you have a light switch at the top of the stairs and the bottom of the stairs. If you do not have them, ask someone to add them for you. What else can I do to help prevent falls? Wear shoes that: Do not have high heels. Have rubber bottoms. Are comfortable and fit you well. Are closed at the toe. Do not wear sandals. If you use a stepladder: Make sure that it is fully opened. Do not climb a closed stepladder. Make sure that both sides of the stepladder are locked into place. Ask someone to hold it for you, if possible. Clearly mark and make sure that you can see: Any grab bars or handrails. First and last steps. Where the edge of each step is. Use tools that help you move around (mobility aids) if they are needed. These  include: Canes. Walkers. Scooters. Crutches. Turn on the lights when you go into a dark area. Replace any light bulbs as soon as they burn out. Set up your furniture so you have a clear path. Avoid moving your furniture around. If any of your floors are uneven, fix them. If there are any pets around you, be aware of where they are. Review your medicines with your doctor. Some medicines can make you feel dizzy. This can increase your chance of falling. Ask your doctor what other things that you can do to help prevent falls. This information is not intended to replace advice given to you by your health care provider. Make sure you discuss any questions you have with your health  care provider. Document Released: 08/19/2009 Document Revised: 03/30/2016 Document Reviewed: 11/27/2014 Elsevier Interactive Patient Education  2017 [employer].   "

## 2025-10-06 ENCOUNTER — Encounter: Admitting: Medical
